# Patient Record
Sex: Female | Born: 1960 | Race: White | Hispanic: No | Marital: Married | Smoking: Current every day smoker
Health system: Southern US, Community
[De-identification: ages and names within clinical notes are randomized; demographics above are authoritative.]

## PROBLEM LIST (undated history)

## (undated) DIAGNOSIS — H409 Unspecified glaucoma: Secondary | ICD-10-CM

## (undated) DIAGNOSIS — K219 Gastro-esophageal reflux disease without esophagitis: Secondary | ICD-10-CM

## (undated) DIAGNOSIS — G43909 Migraine, unspecified, not intractable, without status migrainosus: Secondary | ICD-10-CM

## (undated) HISTORY — PX: OOPHORECTOMY: SHX86

## (undated) HISTORY — DX: Unspecified glaucoma: H40.9

## (undated) HISTORY — DX: Migraine, unspecified, not intractable, without status migrainosus: G43.909

## (undated) HISTORY — DX: Gastro-esophageal reflux disease without esophagitis: K21.9

---

## 2000-07-12 ENCOUNTER — Other Ambulatory Visit: Admission: RE | Admit: 2000-07-12 | Discharge: 2000-07-12 | Payer: Self-pay | Admitting: Obstetrics and Gynecology

## 2003-04-11 ENCOUNTER — Ambulatory Visit (HOSPITAL_COMMUNITY): Admission: RE | Admit: 2003-04-11 | Discharge: 2003-04-11 | Payer: Self-pay | Admitting: Family Medicine

## 2003-04-23 ENCOUNTER — Ambulatory Visit (HOSPITAL_COMMUNITY): Admission: RE | Admit: 2003-04-23 | Discharge: 2003-04-23 | Payer: Self-pay | Admitting: Family Medicine

## 2004-06-28 IMAGING — CR DG LUMBAR SPINE COMPLETE 4+V
5 series · 5 of 5 positions shown · non-contrast
Comparison: none

CLINICAL DATA: Low back pain. 
 FIVE VIEW LUMBAR SPINE
 There is minimal scoliosis with the convexity to the right.  The disc spaces are preserved.  The SI joints are normal. 
 IMPRESSION
 Minimal scoliosis with preservation of the disc spaces.

[view not recorded (1 of 5)]
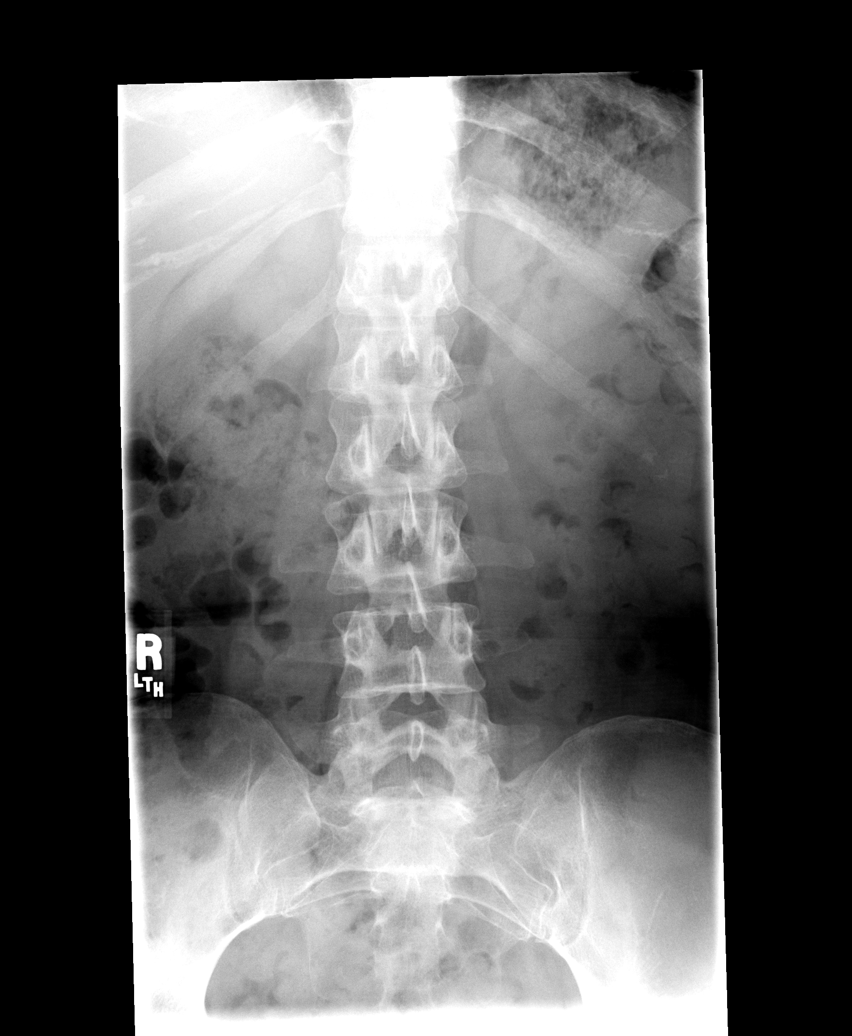

[view not recorded (2 of 5)]
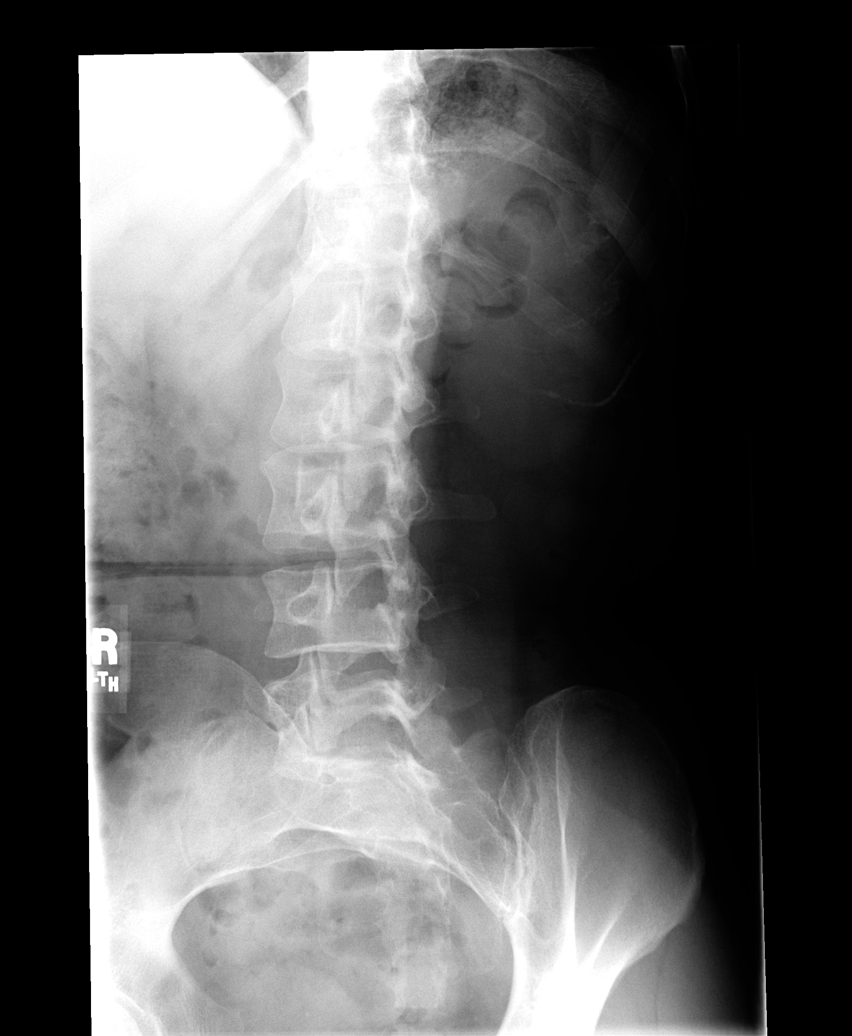

[view not recorded (3 of 5)]
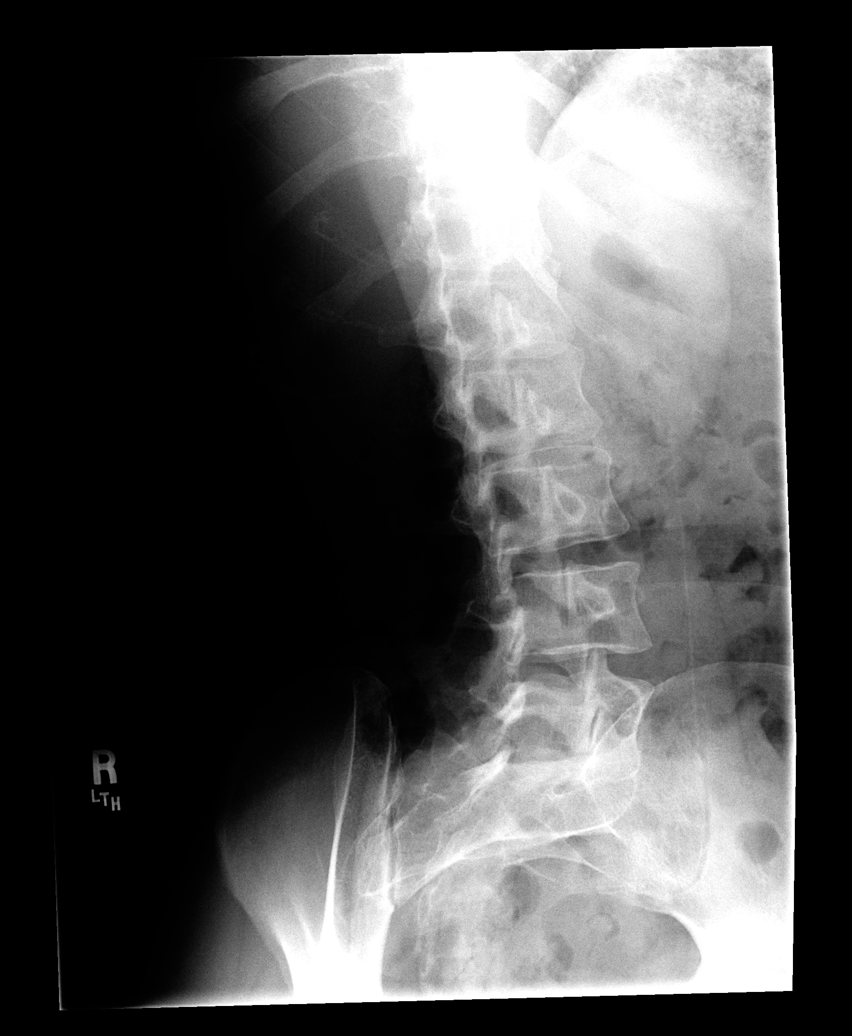

[view not recorded (4 of 5)]
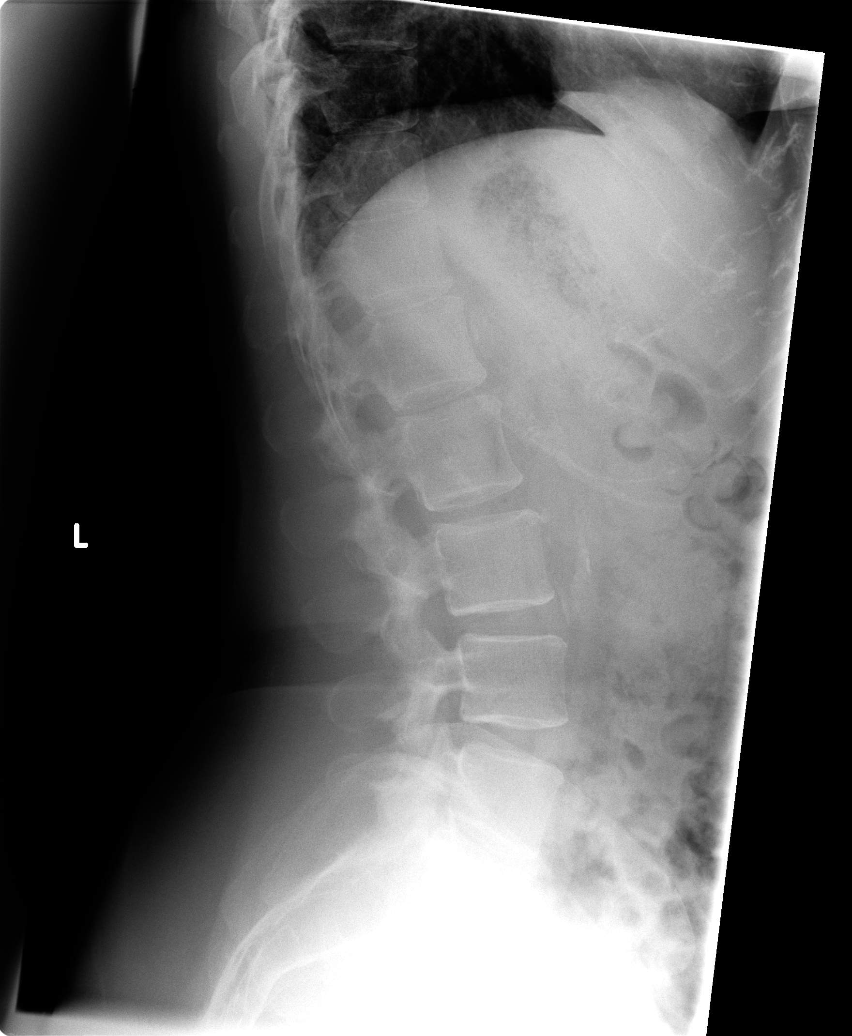

[view not recorded (5 of 5)]
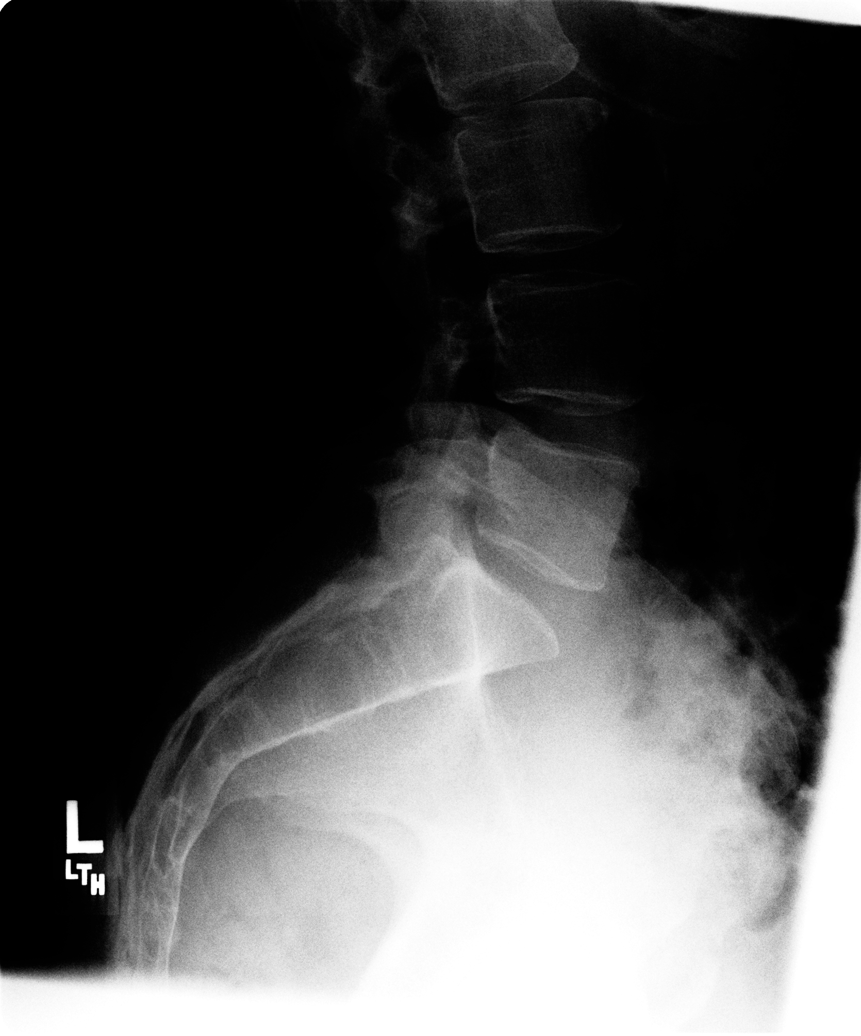

[5 of 5 positions shown; findings below may reference images not displayed]

## 2010-03-27 ENCOUNTER — Encounter: Payer: Self-pay | Admitting: Family Medicine

## 2012-06-06 ENCOUNTER — Other Ambulatory Visit: Payer: Self-pay | Admitting: Family Medicine

## 2012-07-06 ENCOUNTER — Encounter: Payer: Self-pay | Admitting: *Deleted

## 2012-07-08 ENCOUNTER — Encounter: Payer: Self-pay | Admitting: Nurse Practitioner

## 2012-07-08 ENCOUNTER — Ambulatory Visit (INDEPENDENT_AMBULATORY_CARE_PROVIDER_SITE_OTHER): Payer: BC Managed Care – PPO | Admitting: Nurse Practitioner

## 2012-07-08 VITALS — BP 158/102 | Wt 215.4 lb

## 2012-07-08 DIAGNOSIS — K219 Gastro-esophageal reflux disease without esophagitis: Secondary | ICD-10-CM | POA: Insufficient documentation

## 2012-07-08 DIAGNOSIS — F341 Dysthymic disorder: Secondary | ICD-10-CM

## 2012-07-08 DIAGNOSIS — G4700 Insomnia, unspecified: Secondary | ICD-10-CM | POA: Insufficient documentation

## 2012-07-08 DIAGNOSIS — R030 Elevated blood-pressure reading, without diagnosis of hypertension: Secondary | ICD-10-CM

## 2012-07-08 DIAGNOSIS — IMO0001 Reserved for inherently not codable concepts without codable children: Secondary | ICD-10-CM

## 2012-07-08 DIAGNOSIS — H409 Unspecified glaucoma: Secondary | ICD-10-CM | POA: Insufficient documentation

## 2012-07-08 DIAGNOSIS — Z13220 Encounter for screening for lipoid disorders: Secondary | ICD-10-CM

## 2012-07-08 DIAGNOSIS — F329 Major depressive disorder, single episode, unspecified: Secondary | ICD-10-CM | POA: Insufficient documentation

## 2012-07-08 DIAGNOSIS — F32A Depression, unspecified: Secondary | ICD-10-CM | POA: Insufficient documentation

## 2012-07-08 DIAGNOSIS — J309 Allergic rhinitis, unspecified: Secondary | ICD-10-CM

## 2012-07-08 DIAGNOSIS — J3089 Other allergic rhinitis: Secondary | ICD-10-CM

## 2012-07-08 DIAGNOSIS — R5383 Other fatigue: Secondary | ICD-10-CM

## 2012-07-08 DIAGNOSIS — R5381 Other malaise: Secondary | ICD-10-CM

## 2012-07-08 MED ORDER — HYDROCHLOROTHIAZIDE 25 MG PO TABS: 25.0000 mg | ORAL_TABLET | Freq: Every day | ORAL | Status: DC

## 2012-07-08 MED ORDER — TRAZODONE HCL 50 MG PO TABS: 50.0000 mg | ORAL_TABLET | Freq: Every day | ORAL | Status: DC

## 2012-07-08 MED ORDER — HYDROCODONE-ACETAMINOPHEN 5-325 MG PO TABS: 1.0000 | ORAL_TABLET | Freq: Three times a day (TID) | ORAL | Status: DC | PRN

## 2012-07-08 MED ORDER — CITALOPRAM HYDROBROMIDE 40 MG PO TABS: 40.0000 mg | ORAL_TABLET | Freq: Every day | ORAL | Status: DC

## 2012-07-08 NOTE — Assessment & Plan Note (Signed)
Mild at this point, gets regular eye exams. No medication at this time.

## 2012-07-08 NOTE — Patient Instructions (Addendum)
Sleeve gastrectomy.  [facility]  Dr. Lowell Guitar; Loratadine 10 mg; Nasacort AQ as directed.

## 2012-07-08 NOTE — Progress Notes (Signed)
Subjective:  Presents for routine followup. Sleeping well with trazodone, does not take it every night. Anxiety/depression symptoms are stable on Celexa. Reflux stable on OTC Pepcid. No exercise. 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 Took Sudafed this morning for her sinuses. Mild fatigue. Has not had lab work done in a long time. Since working out worse yesterday, has developed off-and-on head congestion itchy watery eyes sneezing ear pressure. No fever or sore throat. Minimal cough. No wheezing.  Objective:   BP 158/102  Wt 215 lb 6.4 oz (97.705 kg) NAD. Alert, oriented. TMs significant clear effusion, no erythema. Pharynx clear. Neck supple with minimal adenopathy. Lungs clear. Heart regular rate rhythm. Conjunctiva clear. BP on recheck right arm sitting 158/102. Central obesity noted. Waist circumference greater than 35 inches.  Assessment:Anxiety and depression  Insomnia  Perennial allergic rhinitis  Fatigue - Plan: Basic metabolic panel, Hepatic function panel, TSH, Vitamin D 25 hydroxy, Basic metabolic panel, Hepatic function panel, TSH, Vitamin D 25 hydroxy  Morbid obesity - Plan: Basic metabolic panel, Hepatic function panel, Basic metabolic panel, Hepatic function panel  Screening, lipid - Plan: Lipid panel, Lipid panel  Elevated blood pressure  Plan: Meds ordered this encounter  Medications  . DISCONTD: HYDROcodone-acetaminophen (NORCO/VICODIN) 5-325 MG per tablet    Sig: 1 tablet every 8 (eight) hours as needed.   . citalopram (CELEXA) 40 MG tablet    Sig: Take 1 tablet (40 mg total) by mouth daily.    Dispense:  90 tablet    Refill:  1    Order Specific Question:  Supervising Provider    Answer:  Merlyn Albert [[id number]]  . traZODone (DESYREL) 50 MG tablet    Sig: Take 1 tablet (50 mg total) by mouth at bedtime.    Dispense:  90 tablet    Refill:  1    Order Specific Question:  Supervising Provider    Answer:  Merlyn Albert [[id number]]  .  hydrochlorothiazide (HYDRODIURIL) 25 MG tablet    Sig: Take 1 tablet (25 mg total) by mouth daily.    Dispense:  90 tablet    Refill:  1    Order Specific Question:  Supervising Provider    Answer:  Merlyn Albert [[id number]]  . HYDROcodone-acetaminophen (NORCO/VICODIN) 5-325 MG per tablet    Sig: Take 1 tablet by mouth every 8 (eight) hours as needed.    Dispense:  30 tablet    Refill:  0    Order Specific Question:  Supervising Provider    Answer:  Merlyn Albert [[id number]]   advised patient to take HCTZ daily. Has been using this off and on in the past for occasional swelling. Recheck BP outside office and bring results to next visit. Lengthy discussion regarding her weight. Discussed options including bariatric surgery. No medication today since blood pressure is elevated. Will reconsider medication once blood pressure is under control. Recommend loratadine 10 mg daily and Nasacort AQ as directed. Avoid decongestants due to blood pressure elevation. Recheck in 6 months, sooner if any problems.

## 2012-07-08 NOTE — Assessment & Plan Note (Signed)
Stable on Pepcid and dietary measures.

## 2012-07-08 NOTE — Assessment & Plan Note (Signed)
Well-controlled with Celexa 40 mg daily.

## 2012-07-08 NOTE — Assessment & Plan Note (Signed)
Taking trazodone for sleep.  

## 2012-08-01 ENCOUNTER — Other Ambulatory Visit: Payer: Self-pay | Admitting: Nurse Practitioner

## 2012-08-27 ENCOUNTER — Other Ambulatory Visit: Payer: Self-pay | Admitting: Nurse Practitioner

## 2012-08-28 ENCOUNTER — Other Ambulatory Visit: Payer: Self-pay | Admitting: *Deleted

## 2012-08-28 MED ORDER — HYDROCODONE-ACETAMINOPHEN 5-325 MG PO TABS: 1.0000 | ORAL_TABLET | Freq: Three times a day (TID) | ORAL | Status: DC | PRN

## 2012-09-23 ENCOUNTER — Other Ambulatory Visit: Payer: Self-pay | Admitting: Family Medicine

## 2012-09-23 NOTE — Telephone Encounter (Signed)
Ok times one 

## 2012-09-23 NOTE — Telephone Encounter (Signed)
RX called in .

## 2012-10-21 ENCOUNTER — Other Ambulatory Visit: Payer: Self-pay | Admitting: Family Medicine

## 2012-10-21 NOTE — Telephone Encounter (Signed)
Last seen 5/14

## 2012-10-21 NOTE — Telephone Encounter (Signed)
Sorry, need paper chart

## 2012-10-21 NOTE — Telephone Encounter (Signed)
Ok times one 

## 2012-11-28 ENCOUNTER — Other Ambulatory Visit: Payer: Self-pay | Admitting: Family Medicine

## 2012-11-28 NOTE — Telephone Encounter (Signed)
Ok times one 

## 2012-11-29 ENCOUNTER — Other Ambulatory Visit: Payer: Self-pay | Admitting: Family Medicine

## 2012-12-02 NOTE — Telephone Encounter (Signed)
Chart in message pile 

## 2012-12-02 NOTE — Telephone Encounter (Signed)
Paper chart plz

## 2012-12-03 ENCOUNTER — Other Ambulatory Visit: Payer: Self-pay | Admitting: *Deleted

## 2012-12-03 MED ORDER — HYDROCODONE-ACETAMINOPHEN 5-325 MG PO TABS: ORAL_TABLET | ORAL | Status: DC

## 2012-12-03 NOTE — Telephone Encounter (Signed)
Ok times one ov bef furth

## 2012-12-19 ENCOUNTER — Ambulatory Visit (INDEPENDENT_AMBULATORY_CARE_PROVIDER_SITE_OTHER): Payer: PRIVATE HEALTH INSURANCE | Admitting: Nurse Practitioner

## 2012-12-19 VITALS — BP 118/86 | Temp 98.7°F | Ht 62.0 in | Wt 206.6 lb

## 2012-12-19 DIAGNOSIS — J0110 Acute frontal sinusitis, unspecified: Secondary | ICD-10-CM

## 2012-12-19 DIAGNOSIS — H9209 Otalgia, unspecified ear: Secondary | ICD-10-CM

## 2012-12-19 DIAGNOSIS — H9201 Otalgia, right ear: Secondary | ICD-10-CM

## 2012-12-19 MED ORDER — FLUCONAZOLE 150 MG PO TABS: ORAL_TABLET | ORAL | Status: DC

## 2012-12-19 MED ORDER — AMOXICILLIN-POT CLAVULANATE 875-125 MG PO TABS: 1.0000 | ORAL_TABLET | Freq: Two times a day (BID) | ORAL | Status: DC

## 2012-12-19 MED ORDER — HYDROCODONE-ACETAMINOPHEN 5-325 MG PO TABS: 1.0000 | ORAL_TABLET | ORAL | Status: DC | PRN

## 2012-12-19 MED ORDER — METHYLPREDNISOLONE ACETATE 40 MG/ML IJ SUSP
40.0000 mg | Freq: Once | INTRAMUSCULAR | Status: AC
  Administered 2012-12-19: 40 mg via INTRAMUSCULAR

## 2012-12-23 ENCOUNTER — Encounter: Payer: Self-pay | Admitting: Nurse Practitioner

## 2012-12-23 NOTE — Progress Notes (Signed)
Subjective:  Presents for complaints of right ear pain for the past 2 days. Has had head congestion and cough for the past few weeks. No fever. Occasional cough. No wheezing. Facial pressure along the right side. Smoker.  Objective:   BP 118/86  Temp(Src) 98.7 F (37.1 C) (Oral)  Ht 5\' 2"  (1.575 m)  Wt 206 lb 9.6 oz (93.713 kg)  BMI 37.78 kg/m2 NAD. Alert, oriented. TMs significant clear effusion, no erythema. Pharynx injected with PND noted. Neck supple with mild soft slightly tender adenopathy. Lungs clear. Heart regular rate rhythm.  Assessment:Acute frontal sinusitis - Plan: methylPREDNISolone acetate (DEPO-MEDROL) injection 40 mg  Ear pain, right - Plan: methylPREDNISolone acetate (DEPO-MEDROL) injection 40 mg  Plan: Meds ordered this encounter  Medications  . HYDROcodone-acetaminophen (NORCO/VICODIN) 5-325 MG per tablet    Sig: Take 1 tablet by mouth every 4 (four) hours as needed for pain.    Dispense:  30 tablet    Refill:  0    Order Specific Question:  Supervising Provider    Answer:  Merlyn Albert [[id number]]  . methylPREDNISolone acetate (DEPO-MEDROL) injection 40 mg    Sig:   . amoxicillin-clavulanate (AUGMENTIN) 875-125 MG per tablet    Sig: Take 1 tablet by mouth 2 (two) times daily.    Dispense:  20 tablet    Refill:  0    Order Specific Question:  Supervising Provider    Answer:  Merlyn Albert [[id number]]  . fluconazole (DIFLUCAN) 150 MG tablet    Sig: One po qd prn yeast infection; may repeat in 3-4 days if needed    Dispense:  2 tablet    Refill:  0    Order Specific Question:  Supervising Provider    Answer:  Merlyn Albert [[id number]]   OTC meds as directed for congestion. Call back next week if no improvement, sooner if worse.

## 2012-12-26 ENCOUNTER — Ambulatory Visit (INDEPENDENT_AMBULATORY_CARE_PROVIDER_SITE_OTHER): Payer: PRIVATE HEALTH INSURANCE | Admitting: Family Medicine

## 2012-12-26 ENCOUNTER — Encounter: Payer: Self-pay | Admitting: Family Medicine

## 2012-12-26 VITALS — BP 130/80 | Temp 98.8°F | Ht 64.0 in | Wt 206.0 lb

## 2012-12-26 DIAGNOSIS — J0110 Acute frontal sinusitis, unspecified: Secondary | ICD-10-CM

## 2012-12-26 MED ORDER — LEVOFLOXACIN 500 MG PO TABS: 500.0000 mg | ORAL_TABLET | Freq: Every day | ORAL | Status: AC

## 2012-12-26 NOTE — Progress Notes (Signed)
  Subjective:    Patient ID: Karen Heath, female    DOB: [birth year], 52 y.o.   MRN: [id number]  Sinus Problem  Patient arrives with complaint of continued sinus issues. Was seen late last week and given a shot of steroid and Augmentin and Hydrocodone but patient states she is not feeling any better. Had to go out of town [place] and came back with continued ear pain, throat pain, headache and cough.  Still having sig headache and cough. No true wheeziness,  Cough not that bad at this time   Right sided pressure nd discomfort   Review of Systems No vomiting no diarrhea no rash ROS otherwise negative    Objective:   Physical Exam  Alert hydration good. Vitals reviewed. HEENT moderate nasal congestion frontal tenderness. Neck supple lungs clear heart regular in rhythm.      Assessment & Plan:  Impression persistent rhinosinusitis plan Levaquin 500 daily 10 days symptomatic care discussed. Warning signs discussed. WSL

## 2013-01-16 ENCOUNTER — Encounter: Payer: Self-pay | Admitting: Nurse Practitioner

## 2013-01-16 ENCOUNTER — Ambulatory Visit (INDEPENDENT_AMBULATORY_CARE_PROVIDER_SITE_OTHER): Payer: PRIVATE HEALTH INSURANCE | Admitting: Nurse Practitioner

## 2013-01-16 VITALS — BP 144/100 | Ht 62.0 in | Wt 205.6 lb

## 2013-01-16 DIAGNOSIS — F329 Major depressive disorder, single episode, unspecified: Secondary | ICD-10-CM

## 2013-01-16 DIAGNOSIS — K219 Gastro-esophageal reflux disease without esophagitis: Secondary | ICD-10-CM

## 2013-01-16 DIAGNOSIS — F341 Dysthymic disorder: Secondary | ICD-10-CM

## 2013-01-16 DIAGNOSIS — F32A Depression, unspecified: Secondary | ICD-10-CM

## 2013-01-16 MED ORDER — ALPRAZOLAM 0.5 MG PO TABS: ORAL_TABLET | ORAL | Status: DC

## 2013-01-16 MED ORDER — RANITIDINE HCL 300 MG PO TABS: 300.0000 mg | ORAL_TABLET | Freq: Every day | ORAL | Status: DC

## 2013-01-16 MED ORDER — TRAZODONE HCL 100 MG PO TABS: 100.0000 mg | ORAL_TABLET | Freq: Every day | ORAL | Status: DC

## 2013-01-19 ENCOUNTER — Encounter: Payer: Self-pay | Admitting: Nurse Practitioner

## 2013-01-19 NOTE — Assessment & Plan Note (Signed)
Medications  . DISCONTD: levofloxacin (LEVAQUIN) 500 MG tablet    Sig:   . ALPRAZolam (XANAX) 0.5 MG tablet    Sig: 1/2 - 1 po BID prn anxiety    Dispense:  30 tablet    Refill:  2    Order Specific Question:  Supervising Provider    Answer:  Merlyn Albert [[id number]]  . traZODone (DESYREL) 100 MG tablet    Sig: Take 1 tablet (100 mg total) by mouth at bedtime.    Dispense:  90 tablet    Refill:  1    Order Specific Question:  Supervising Provider    Answer:  Merlyn Albert [[id number]]  . ranitidine (ZANTAC) 300 MG tablet    Sig: Take 1 tablet (300 mg total) by mouth at bedtime.    Dispense:  90 tablet    Refill:  1    Order Specific Question:  Supervising Provider    Answer:  Merlyn Albert [[id number]]   increase trazodone to 100 mg each bedtime. Increase Zantac to 300 mg daily. Add Zantac for breakthrough anxiety symptoms. Call back in 2-3 weeks if no improvement, sooner if needed. Our plan is to hopefully reduce dose of medications once her stress has improved.

## 2013-01-19 NOTE — Assessment & Plan Note (Signed)
Medications  . DISCONTD: levofloxacin (LEVAQUIN) 500 MG tablet    Sig:   . ALPRAZolam (XANAX) 0.5 MG tablet    Sig: 1/2 - 1 po BID prn anxiety    Dispense:  30 tablet    Refill:  2    Order Specific Question:  Supervising Provider    Answer:  LUKING, WILLIAM S [[id number]]  . traZODone (DESYREL) 100 MG tablet    Sig: Take 1 tablet (100 mg total) by mouth at bedtime.    Dispense:  90 tablet    Refill:  1    Order Specific Question:  Supervising Provider    Answer:  LUKING, WILLIAM S [[id number]]  . ranitidine (ZANTAC) 300 MG tablet    Sig: Take 1 tablet (300 mg total) by mouth at bedtime.    Dispense:  90 tablet    Refill:  1    Order Specific Question:  Supervising Provider    Answer:  LUKING, WILLIAM S [[id number]]   increase trazodone to 100 mg each bedtime. Increase Zantac to 300 mg daily. Add Zantac for breakthrough anxiety symptoms. Call back in 2-3 weeks if no improvement, sooner if needed. Our plan is to hopefully reduce dose of medications once her stress has improved. 

## 2013-01-19 NOTE — Progress Notes (Signed)
Subjective:  Presents with complaints of increased anxiety related to work. States it is a temporary situation that should improve over time. Feels "sick" contents. No panic attacks. Increased fatigue. Sleeping well overall. Occasional breakthrough reflux symptoms, taking Zantac 150 mg daily. No abdominal pain. BMs normal limit. Emotional lability. Denies suicidal thoughts or ideation.  Objective:   BP 144/100  Ht 5\' 2"  (1.575 m)  Wt 205 lb 9.6 oz (93.26 kg)  BMI 37.60 kg/m2 NAD. Alert, oriented. Lungs clear. Heart regular rate rhythm. Abdomen soft nondistended nontender. BP on recheck left arm sitting 138/102.  Assessment:Anxiety and depression  GERD (gastroesophageal reflux disease)  Plan: Meds ordered this encounter  Medications  . DISCONTD: levofloxacin (LEVAQUIN) 500 MG tablet    Sig:   . ALPRAZolam (XANAX) 0.5 MG tablet    Sig: 1/2 - 1 po BID prn anxiety    Dispense:  30 tablet    Refill:  2    Order Specific Question:  Supervising Provider    Answer:  Merlyn Albert [[id number]]  . traZODone (DESYREL) 100 MG tablet    Sig: Take 1 tablet (100 mg total) by mouth at bedtime.    Dispense:  90 tablet    Refill:  1    Order Specific Question:  Supervising Provider    Answer:  Merlyn Albert [[id number]]  . ranitidine (ZANTAC) 300 MG tablet    Sig: Take 1 tablet (300 mg total) by mouth at bedtime.    Dispense:  90 tablet    Refill:  1    Order Specific Question:  Supervising Provider    Answer:  Merlyn Albert [[id number]]   increase trazodone to 100 mg each bedtime. Increase Zantac to 300 mg daily. Add Zantac for breakthrough anxiety symptoms. Call back in 2-3 weeks if no improvement, sooner if needed. Our plan is to hopefully reduce dose of medications once her stress has improved.

## 2013-02-05 ENCOUNTER — Other Ambulatory Visit: Payer: Self-pay | Admitting: Nurse Practitioner

## 2013-03-28 ENCOUNTER — Other Ambulatory Visit: Payer: Self-pay | Admitting: *Deleted

## 2013-03-28 MED ORDER — ALPRAZOLAM 0.5 MG PO TABS: ORAL_TABLET | ORAL | Status: DC

## 2013-03-28 NOTE — Progress Notes (Signed)
Ok plus 2 ref 

## 2013-09-25 ENCOUNTER — Other Ambulatory Visit: Payer: Self-pay | Admitting: Nurse Practitioner

## 2013-09-29 ENCOUNTER — Other Ambulatory Visit: Payer: Self-pay | Admitting: Nurse Practitioner

## 2013-09-29 MED ORDER — TRAZODONE HCL 100 MG PO TABS: 100.0000 mg | ORAL_TABLET | Freq: Every day | ORAL | Status: DC

## 2014-02-13 ENCOUNTER — Other Ambulatory Visit: Payer: Self-pay | Admitting: Family Medicine

## 2014-03-30 ENCOUNTER — Encounter: Payer: Self-pay | Admitting: Nurse Practitioner

## 2014-03-30 ENCOUNTER — Ambulatory Visit (INDEPENDENT_AMBULATORY_CARE_PROVIDER_SITE_OTHER): Payer: PRIVATE HEALTH INSURANCE | Admitting: Nurse Practitioner

## 2014-03-30 VITALS — BP 132/86 | Temp 98.3°F | Ht 62.0 in | Wt 208.0 lb

## 2014-03-30 DIAGNOSIS — J0120 Acute ethmoidal sinusitis, unspecified: Secondary | ICD-10-CM

## 2014-03-30 DIAGNOSIS — H109 Unspecified conjunctivitis: Secondary | ICD-10-CM

## 2014-03-30 MED ORDER — AMOXICILLIN-POT CLAVULANATE 875-125 MG PO TABS: 1.0000 | ORAL_TABLET | Freq: Two times a day (BID) | ORAL | Status: DC

## 2014-03-30 MED ORDER — HYDROCHLOROTHIAZIDE 25 MG PO TABS: ORAL_TABLET | ORAL | Status: DC

## 2014-03-30 MED ORDER — HYDROCODONE-ACETAMINOPHEN 5-325 MG PO TABS: 1.0000 | ORAL_TABLET | ORAL | Status: DC | PRN

## 2014-03-30 MED ORDER — CITALOPRAM HYDROBROMIDE 40 MG PO TABS: ORAL_TABLET | ORAL | Status: DC

## 2014-03-30 MED ORDER — SULFACETAMIDE SODIUM 10 % OP SOLN: 1.0000 [drp] | Freq: Four times a day (QID) | OPHTHALMIC | Status: DC

## 2014-03-31 ENCOUNTER — Encounter: Payer: Self-pay | Admitting: Nurse Practitioner

## 2014-03-31 NOTE — Progress Notes (Signed)
Subjective:  Presents for c/o possible sinus infection for the past 2 weeks. No fever. Ethmoid area headache. Occasional cough, non productive. No wheezing. Sore throat. No ear pain. Had a stye on her right eye which resolved. Within past 24 hours has developed soreness, itching and drainage. Matted together this am. No visual changes or eye pain. No involvement of left eye at this time. Has appt soon for regular follow up.  Objective:   BP 132/86 mmHg  Temp(Src) 98.3 F (36.8 C)  Ht 5\' 2"  (1.575 m)  Wt 208 lb (94.348 kg)  BMI 38.03 kg/m2 NAD. Alert, oriented. Right conjunctiva mildly injected. No lesions noted. left conjunctiva very mildly injected. No preauricular adenopathy. TMs mild clear effusion, partially blocked with cerumen. Pharynx mild erythema with PND noted. Neck supple with mild anterior adenopathy. Lungs clear. Heart RRR.   Assessment: Acute ethmoidal sinusitis, recurrence not specified  Conjunctivitis of right eye  Plan:  Meds ordered this encounter  Medications  . amoxicillin-clavulanate (AUGMENTIN) 875-125 MG per tablet    Sig: Take 1 tablet by mouth 2 (two) times daily.    Dispense:  20 tablet    Refill:  0    Order Specific Question:  Supervising Provider    Answer:  Merlyn AlbertLUKING, WILLIAM S [[id number]]  . sulfacetamide (BLEPH-10) 10 % ophthalmic solution    Sig: Place 1 drop into the right eye 4 (four) times daily.    Dispense:  10 mL    Refill:  0    Order Specific Question:  Supervising Provider    Answer:  Merlyn AlbertLUKING, WILLIAM S [[id number]]  . citalopram (CELEXA) 40 MG tablet    Sig: TAKE ONE TABLET BY MOUTH ONCE DAILY **NEEDS OFFICE VISIT**    Dispense:  30 tablet    Refill:  0    Order Specific Question:  Supervising Provider    Answer:  Merlyn AlbertLUKING, WILLIAM S [[id number]]  . hydrochlorothiazide (HYDRODIURIL) 25 MG tablet    Sig: TAKE ONE TABLET BY MOUTH ONCE DAILY **NEEDS OFFICE VISIT**    Dispense:  30 tablet    Refill:  0    Order Specific Question:  Supervising Provider   Answer:  Merlyn AlbertLUKING, WILLIAM S [[id number]]  . HYDROcodone-acetaminophen (NORCO/VICODIN) 5-325 MG per tablet    Sig: Take 1 tablet by mouth every 4 (four) hours as needed.    Dispense:  30 tablet    Refill:  0    Order Specific Question:  Supervising Provider    Answer:  Merlyn AlbertLUKING, WILLIAM S [[id number]]   Call back by end of week if no improvement. Warm compresses to remove drainage. OTC meds as directed for congestion. Given one month refill on meds until routine follow up. One refill on hydrocodone for headache.  Return if symptoms worsen or fail to improve.

## 2014-04-06 ENCOUNTER — Ambulatory Visit (INDEPENDENT_AMBULATORY_CARE_PROVIDER_SITE_OTHER): Payer: PRIVATE HEALTH INSURANCE | Admitting: Nurse Practitioner

## 2014-04-06 ENCOUNTER — Encounter: Payer: Self-pay | Admitting: Nurse Practitioner

## 2014-04-06 VITALS — BP 122/88 | Ht 62.0 in | Wt 202.0 lb

## 2014-04-06 DIAGNOSIS — F418 Other specified anxiety disorders: Secondary | ICD-10-CM

## 2014-04-06 DIAGNOSIS — F419 Anxiety disorder, unspecified: Principal | ICD-10-CM

## 2014-04-06 DIAGNOSIS — G4700 Insomnia, unspecified: Secondary | ICD-10-CM

## 2014-04-06 DIAGNOSIS — F329 Major depressive disorder, single episode, unspecified: Secondary | ICD-10-CM

## 2014-04-06 DIAGNOSIS — G43909 Migraine, unspecified, not intractable, without status migrainosus: Secondary | ICD-10-CM | POA: Insufficient documentation

## 2014-04-06 DIAGNOSIS — G43009 Migraine without aura, not intractable, without status migrainosus: Secondary | ICD-10-CM

## 2014-04-06 DIAGNOSIS — F32A Depression, unspecified: Secondary | ICD-10-CM

## 2014-04-06 DIAGNOSIS — K219 Gastro-esophageal reflux disease without esophagitis: Secondary | ICD-10-CM

## 2014-04-06 MED ORDER — HYDROCODONE-ACETAMINOPHEN 5-325 MG PO TABS: 1.0000 | ORAL_TABLET | ORAL | Status: DC | PRN

## 2014-04-06 MED ORDER — LEVOFLOXACIN 500 MG PO TABS: 500.0000 mg | ORAL_TABLET | Freq: Every day | ORAL | Status: DC

## 2014-04-06 MED ORDER — CITALOPRAM HYDROBROMIDE 40 MG PO TABS: ORAL_TABLET | ORAL | Status: DC

## 2014-04-06 MED ORDER — TRAZODONE HCL 100 MG PO TABS: 100.0000 mg | ORAL_TABLET | Freq: Every day | ORAL | Status: DC

## 2014-04-06 MED ORDER — HYDROCHLOROTHIAZIDE 25 MG PO TABS: ORAL_TABLET | ORAL | Status: DC

## 2014-04-06 NOTE — Progress Notes (Signed)
Subjective:  Presents for recheck. Had significant side effects on Augmentin. Eye symptoms resolved. Continues to have some headache. Nausea, no vomiting. Worsened cough. Anxiety and depression stable on Celexa. Does not take Xanax. Takes hydrocodone on prn basis for sinus headache or migraine. No more than 30 per month. Reflux stable. Takes OTC Zantac prn. Sleeping well.  Objective:   BP 122/88 mmHg  Ht 5\' 2"  (1.575 m)  Wt 202 lb (91.627 kg)  BMI 36.94 kg/m2 NAD. Alert, oriented. Right TM: clear effusion. Left TM: mostly obscured with cerumen; TM no erythema. Pharynx green PND. Neck supple with mild anterior adenopathy. Lungs clear. Heart RRR. Abdomen soft, non tender.   Assessment:  Problem List Items Addressed This Visit      Cardiovascular and Mediastinum   Migraines   Relevant Medications   citalopram (CELEXA) tablet   hydrochlorothiazide tablet   traZODone (DESYREL) tablet   HYDROcodone-acetaminophen (NORCO/VICODIN) 5-325 MG per tablet     Digestive   GERD (gastroesophageal reflux disease) stable     Other   Anxiety and depression - Primary   Insomnia     Plan:  Meds ordered this encounter  Medications  . levofloxacin (LEVAQUIN) 500 MG tablet    Sig: Take 1 tablet (500 mg total) by mouth daily.    Dispense:  10 tablet    Refill:  0    Order Specific Question:  Supervising Provider    Answer:  Merlyn AlbertLUKING, WILLIAM S [[id number]]  . citalopram (CELEXA) 40 MG tablet    Sig: TAKE ONE TABLET BY MOUTH ONCE DAILY    Dispense:  90 tablet    Refill:  1    Order Specific Question:  Supervising Provider    Answer:  Merlyn AlbertLUKING, WILLIAM S [[id number]]  . hydrochlorothiazide (HYDRODIURIL) 25 MG tablet    Sig: TAKE ONE TABLET BY MOUTH ONCE DAILY    Dispense:  90 tablet    Refill:  1    Order Specific Question:  Supervising Provider    Answer:  Merlyn AlbertLUKING, WILLIAM S [[id number]]  . DISCONTD: HYDROcodone-acetaminophen (NORCO/VICODIN) 5-325 MG per tablet    Sig: Take 1 tablet by mouth every 4 (four) hours as  needed.    Dispense:  30 tablet    Refill:  0    Order Specific Question:  Supervising Provider    Answer:  Merlyn AlbertLUKING, WILLIAM S [[id number]]  . traZODone (DESYREL) 100 MG tablet    Sig: Take 1 tablet (100 mg total) by mouth at bedtime.    Dispense:  90 tablet    Refill:  1    Order Specific Question:  Supervising Provider    Answer:  Merlyn AlbertLUKING, WILLIAM S [[id number]]  . DISCONTD: HYDROcodone-acetaminophen (NORCO/VICODIN) 5-325 MG per tablet    Sig: Take 1 tablet by mouth every 4 (four) hours as needed.    Dispense:  30 tablet    Refill:  0    May fill 30 days from 04/06/14    Order Specific Question:  Supervising Provider    Answer:  Merlyn AlbertLUKING, WILLIAM S [[id number]]  . HYDROcodone-acetaminophen (NORCO/VICODIN) 5-325 MG per tablet    Sig: Take 1 tablet by mouth every 4 (four) hours as needed.    Dispense:  30 tablet    Refill:  0    May fill 60 days from 04/06/14    Order Specific Question:  Supervising Provider    Answer:  Riccardo DubinLUKING, WILLIAM S [[id number]]   Strongly recommend PE with labs.  Return in about 6 months (around 10/05/2014). Recheck  sooner if she needs refill on pain meds.

## 2014-08-31 ENCOUNTER — Encounter: Payer: Self-pay | Admitting: Nurse Practitioner

## 2014-08-31 ENCOUNTER — Ambulatory Visit (INDEPENDENT_AMBULATORY_CARE_PROVIDER_SITE_OTHER): Payer: PRIVATE HEALTH INSURANCE | Admitting: Nurse Practitioner

## 2014-08-31 VITALS — BP 132/90 | Ht 62.0 in | Wt 192.1 lb

## 2014-08-31 DIAGNOSIS — M6248 Contracture of muscle, other site: Secondary | ICD-10-CM

## 2014-08-31 DIAGNOSIS — M62838 Other muscle spasm: Secondary | ICD-10-CM

## 2014-08-31 DIAGNOSIS — G43009 Migraine without aura, not intractable, without status migrainosus: Secondary | ICD-10-CM

## 2014-08-31 DIAGNOSIS — Z79891 Long term (current) use of opiate analgesic: Secondary | ICD-10-CM | POA: Diagnosis not present

## 2014-08-31 MED ORDER — CHLORZOXAZONE 500 MG PO TABS: 500.0000 mg | ORAL_TABLET | Freq: Three times a day (TID) | ORAL | Status: DC | PRN

## 2014-08-31 MED ORDER — HYDROCODONE-ACETAMINOPHEN 5-325 MG PO TABS: 1.0000 | ORAL_TABLET | ORAL | Status: DC | PRN

## 2014-08-31 NOTE — Patient Instructions (Signed)
Icy Hot smart Relief OTC TENS unit

## 2014-09-04 ENCOUNTER — Encounter: Payer: Self-pay | Admitting: Nurse Practitioner

## 2014-09-04 DIAGNOSIS — Z79891 Long term (current) use of opiate analgesic: Secondary | ICD-10-CM | POA: Insufficient documentation

## 2014-09-04 NOTE — Progress Notes (Signed)
Subjective:  Presents for routine follow-up. Taking her pain medicine as needed. Anywhere from 0-3 per day. Continues to have tight muscles and pain particularly in the right upper back area. Pain medication last her to function and perform activities of daily living.   Objective:   BP 132/90 mmHg  Ht 5\' 2"  (1.575 m)  Wt 192 lb 2 oz (87.147 kg)  BMI 35.13 kg/m2 NAD. Alert, oriented. Lungs clear. Heart regular rhythm. Very tight tender muscles noted along the upper back and neck area. Good ROM of the neck noted.  Assessment:  Problem List Items Addressed This Visit      Cardiovascular and Mediastinum   Migraines - Primary   Relevant Medications   HYDROcodone-acetaminophen (NORCO/VICODIN) 5-325 MG per tablet   chlorzoxazone (PARAFON) 500 MG tablet     Other   Encounter for long-term use of opiate analgesic   Relevant Orders   ToxASSURE Select 13 (MW), Urine (Completed)    Other Visit Diagnoses    Muscle spasms of head or neck          Plan:  Meds ordered this encounter  Medications  . DISCONTD: HYDROcodone-acetaminophen (NORCO/VICODIN) 5-325 MG per tablet    Sig: Take 1 tablet by mouth every 4 (four) hours as needed.    Dispense:  30 tablet    Refill:  0    May fill 60 days from 08/31/14    Order Specific Question:  Supervising Provider    Answer:  Merlyn AlbertLUKING, WILLIAM S [[id number]]  . DISCONTD: HYDROcodone-acetaminophen (NORCO/VICODIN) 5-325 MG per tablet    Sig: Take 1 tablet by mouth every 4 (four) hours as needed.    Dispense:  30 tablet    Refill:  0    May fill 30 days from 08/31/14    Order Specific Question:  Supervising Provider    Answer:  Merlyn AlbertLUKING, WILLIAM S [[id number]]  . HYDROcodone-acetaminophen (NORCO/VICODIN) 5-325 MG per tablet    Sig: Take 1 tablet by mouth every 4 (four) hours as needed.    Dispense:  30 tablet    Refill:  0    Order Specific Question:  Supervising Provider    Answer:  Merlyn AlbertLUKING, WILLIAM S [[id number]]  . chlorzoxazone (PARAFON) 500 MG tablet    Sig: Take 1  tablet (500 mg total) by mouth 3 (three) times daily as needed for muscle spasms.    Dispense:  30 tablet    Refill:  0    Order Specific Question:  Supervising Provider    Answer:  Merlyn AlbertLUKING, WILLIAM S [[id number]]   Controlled substance agreement and urine screening done today. Discussed addiction potential of hydrocodone. Limit to no more than 30 per month. Add muscle relaxant. Recommend massage therapy, OTC TENS unit and ice/heat applications to neck area. Discussed importance of stress reduction. Strongly recommend preventive health physical. Return in about 3 months (around 12/01/2014) for recheck.

## 2014-09-05 LAB — TOXASSURE SELECT 13 (MW), URINE: PDF: 0

## 2014-12-01 ENCOUNTER — Ambulatory Visit: Payer: PRIVATE HEALTH INSURANCE | Admitting: Nurse Practitioner

## 2014-12-04 ENCOUNTER — Encounter: Payer: Self-pay | Admitting: Nurse Practitioner

## 2014-12-04 ENCOUNTER — Ambulatory Visit (INDEPENDENT_AMBULATORY_CARE_PROVIDER_SITE_OTHER): Payer: PRIVATE HEALTH INSURANCE | Admitting: Nurse Practitioner

## 2014-12-04 VITALS — BP 138/88 | Ht 62.0 in | Wt 188.2 lb

## 2014-12-04 DIAGNOSIS — F329 Major depressive disorder, single episode, unspecified: Secondary | ICD-10-CM

## 2014-12-04 DIAGNOSIS — R5383 Other fatigue: Secondary | ICD-10-CM

## 2014-12-04 DIAGNOSIS — F418 Other specified anxiety disorders: Secondary | ICD-10-CM | POA: Diagnosis not present

## 2014-12-04 DIAGNOSIS — F419 Anxiety disorder, unspecified: Secondary | ICD-10-CM

## 2014-12-04 DIAGNOSIS — Z13220 Encounter for screening for lipoid disorders: Secondary | ICD-10-CM

## 2014-12-04 DIAGNOSIS — G4700 Insomnia, unspecified: Secondary | ICD-10-CM

## 2014-12-04 DIAGNOSIS — Z79891 Long term (current) use of opiate analgesic: Secondary | ICD-10-CM | POA: Diagnosis not present

## 2014-12-04 DIAGNOSIS — F32A Depression, unspecified: Secondary | ICD-10-CM

## 2014-12-04 MED ORDER — HYDROCHLOROTHIAZIDE 25 MG PO TABS: ORAL_TABLET | ORAL | Status: DC

## 2014-12-04 MED ORDER — HYDROCODONE-ACETAMINOPHEN 5-325 MG PO TABS: 1.0000 | ORAL_TABLET | ORAL | Status: DC | PRN

## 2014-12-04 MED ORDER — CHLORZOXAZONE 500 MG PO TABS: 500.0000 mg | ORAL_TABLET | Freq: Three times a day (TID) | ORAL | Status: DC | PRN

## 2014-12-04 MED ORDER — TRAZODONE HCL 100 MG PO TABS: 100.0000 mg | ORAL_TABLET | Freq: Every day | ORAL | Status: DC

## 2014-12-04 MED ORDER — CITALOPRAM HYDROBROMIDE 40 MG PO TABS: ORAL_TABLET | ORAL | Status: DC

## 2014-12-04 NOTE — Progress Notes (Signed)
Subjective:  This patient was seen today for chronic pain  The medication list was reviewed and updated.   -Compliance with pain medication: yes  The patient was advised the importance of maintaining medication and not using illegal substances with these.  Refills needed: yes  The patient was educated that we can provide 3 monthly scripts for their medication, it is their responsibility to follow the instructions.  Side effects or complications from medications: none  Patient is aware that pain medications are meant to minimize the severity of the pain to allow their pain levels to improve to allow for better function. They are aware of that pain medications cannot totally remove their pain.  Due for UDT ( at least once per year) : done June 2016.  Also needs refills on her Celexa and trazodone both of which are working very well at this time. Continues to experience some fatigue. Trazodone working well for sleep.      Objective:   BP 144/90 mmHg  Ht  (1.575 m)  Wt 188 lb 4 oz (85.39 kg)  BMI 34.42 kg/m2 NAD. Alert, oriented. Lungs clear. Heart regular rate rhythm.  Assessment:  Problem List Items Addressed This Visit      Other   Anxiety and depression   Encounter for long-term use of opiate analgesic - Primary   Relevant Orders   Hepatic function panel   Basic metabolic panel   Insomnia    Other Visit Diagnoses    Screening, lipid        Relevant Orders    Lipid panel    Other fatigue        Relevant Orders    TSH    Vit D  25 hydroxy (rtn osteoporosis monitoring)      Plan:  Meds ordered this encounter  Medications  . chlorzoxazone (PARAFON) 500 MG tablet    Sig: Take 1 tablet (500 mg total) by mouth 3 (three) times daily as needed for muscle spasms.    Dispense:  30 tablet    Refill:  0    Order Specific Question:  Supervising Trejon Duford    Answer:  Merlyn Albert [[id number]]  . citalopram (CELEXA) 40 MG tablet    Sig: TAKE ONE TABLET BY MOUTH ONCE  DAILY    Dispense:  90 tablet    Refill:  1    Order Specific Question:  Supervising Valyn Latchford    Answer:  Merlyn Albert [[id number]]  . traZODone (DESYREL) 100 MG tablet    Sig: Take 1 tablet (100 mg total) by mouth at bedtime.    Dispense:  90 tablet    Refill:  1    Order Specific Question:  Supervising Leif Loflin    Answer:  Merlyn Albert [[id number]]  . hydrochlorothiazide (HYDRODIURIL) 25 MG tablet    Sig: TAKE ONE TABLET BY MOUTH ONCE DAILY    Dispense:  90 tablet    Refill:  1    Order Specific Question:  Supervising Halima Fogal    Answer:  Merlyn Albert [[id number]]  . DISCONTD: HYDROcodone-acetaminophen (NORCO/VICODIN) 5-325 MG tablet    Sig: Take 1 tablet by mouth every 4 (four) hours as needed.    Dispense:  30 tablet    Refill:  0    Order Specific Question:  Supervising Camera Krienke    Answer:  Merlyn Albert [[id number]]  . DISCONTD: HYDROcodone-acetaminophen (NORCO/VICODIN) 5-325 MG tablet    Sig: Take 1 tablet by mouth every 4 (four) hours as needed.  Dispense:  30 tablet    Refill:  0    May fill 30 days from 12/04/14    Order Specific Question:  Supervising Nihira Puello    Answer:  Merlyn Albert [[id number]]  . HYDROcodone-acetaminophen (NORCO/VICODIN) 5-325 MG tablet    Sig: Take 1 tablet by mouth every 4 (four) hours as needed.    Dispense:  30 tablet    Refill:  0    May fill 60 days from 12/04/14    Order Specific Question:  Supervising Lloyde Ludlam    Answer:  Merlyn Albert [[id number]]   Strongly recommend preventive health physical. Routine lab work ordered today. Defers flu vaccine but recommend that she reconsider due to a young grandchild in the home. Return in about 3 months (around 03/05/2015) for recheck.

## 2014-12-05 ENCOUNTER — Encounter: Payer: Self-pay | Admitting: Nurse Practitioner

## 2015-03-04 ENCOUNTER — Encounter: Payer: PRIVATE HEALTH INSURANCE | Admitting: Nurse Practitioner

## 2015-03-12 ENCOUNTER — Ambulatory Visit (INDEPENDENT_AMBULATORY_CARE_PROVIDER_SITE_OTHER): Payer: PRIVATE HEALTH INSURANCE | Admitting: Nurse Practitioner

## 2015-03-12 ENCOUNTER — Encounter: Payer: Self-pay | Admitting: Nurse Practitioner

## 2015-03-12 VITALS — BP 122/78 | Ht 62.0 in | Wt 181.4 lb

## 2015-03-12 DIAGNOSIS — Z79891 Long term (current) use of opiate analgesic: Secondary | ICD-10-CM

## 2015-03-12 MED ORDER — HYDROCODONE-ACETAMINOPHEN 5-325 MG PO TABS: 1.0000 | ORAL_TABLET | ORAL | Status: DC | PRN

## 2015-03-12 NOTE — Progress Notes (Signed)
Subjective:  This patient was seen today for chronic pain  The medication list was reviewed and updated.   -Compliance with medication: yes  - Number patient states they take daily: as needed  -when was the last dose patient took? Tuesday; 3 days ago  The patient was advised the importance of maintaining medication and not using illegal substances with these.  Refills needed: yes  The patient was educated that we can provide 3 monthly scripts for their medication, it is their responsibility to follow the instructions.  Side effects or complications from medications: none   Patient is aware that pain medications are meant to minimize the severity of the pain to allow their pain levels to improve to allow for better function. They are aware of that pain medications cannot totally remove their pain.  Due for UDT ( at least once per year) : 08/31/14       Objective:   BP 122/78 mmHg  Ht 5\' 2"  (1.575 m)  Wt 181 lb 6.4 oz (82.283 kg)  BMI 33.17 kg/m2 NAD. Alert, oriented. Lungs clear. Heart RRR.   Assessment:  Problem List Items Addressed This Visit      Other   Encounter for long-term use of opiate analgesic - Primary     Plan:  Meds ordered this encounter  Medications  . DISCONTD: HYDROcodone-acetaminophen (NORCO/VICODIN) 5-325 MG tablet    Sig: Take 1 tablet by mouth every 4 (four) hours as needed.    Dispense:  30 tablet    Refill:  0    May fill 60 days from 03/12/15    Order Specific Question:  Supervising Provider    Answer:  Merlyn AlbertLUKING, WILLIAM S [[id number]]  . DISCONTD: HYDROcodone-acetaminophen (NORCO/VICODIN) 5-325 MG tablet    Sig: Take 1 tablet by mouth every 4 (four) hours as needed.    Dispense:  30 tablet    Refill:  0    May fill 30 days from 03/12/15    Order Specific Question:  Supervising Provider    Answer:  Merlyn AlbertLUKING, WILLIAM S [[id number]]  . HYDROcodone-acetaminophen (NORCO/VICODIN) 5-325 MG tablet    Sig: Take 1 tablet by mouth every 4 (four) hours as needed.   Dispense:  30 tablet    Refill:  0    Order Specific Question:  Supervising Provider    Answer:  Merlyn AlbertLUKING, WILLIAM S [[id number]]   Return in about 3 months (around 06/10/2015) for recheck. Encouraged preventive health physical.

## 2015-06-10 ENCOUNTER — Ambulatory Visit: Payer: PRIVATE HEALTH INSURANCE | Admitting: Nurse Practitioner

## 2015-06-25 ENCOUNTER — Ambulatory Visit (INDEPENDENT_AMBULATORY_CARE_PROVIDER_SITE_OTHER): Payer: PRIVATE HEALTH INSURANCE | Admitting: Nurse Practitioner

## 2015-06-25 ENCOUNTER — Encounter: Payer: Self-pay | Admitting: Nurse Practitioner

## 2015-06-25 VITALS — BP 112/74 | Ht 62.0 in | Wt 188.0 lb

## 2015-06-25 DIAGNOSIS — Z79891 Long term (current) use of opiate analgesic: Secondary | ICD-10-CM | POA: Diagnosis not present

## 2015-06-25 DIAGNOSIS — F419 Anxiety disorder, unspecified: Secondary | ICD-10-CM

## 2015-06-25 DIAGNOSIS — F329 Major depressive disorder, single episode, unspecified: Secondary | ICD-10-CM

## 2015-06-25 DIAGNOSIS — F32A Depression, unspecified: Secondary | ICD-10-CM

## 2015-06-25 DIAGNOSIS — G4700 Insomnia, unspecified: Secondary | ICD-10-CM | POA: Diagnosis not present

## 2015-06-25 DIAGNOSIS — F418 Other specified anxiety disorders: Secondary | ICD-10-CM

## 2015-06-25 MED ORDER — CITALOPRAM HYDROBROMIDE 40 MG PO TABS: ORAL_TABLET | ORAL | Status: DC

## 2015-06-25 MED ORDER — HYDROCODONE-ACETAMINOPHEN 5-325 MG PO TABS: 1.0000 | ORAL_TABLET | ORAL | Status: DC | PRN

## 2015-06-25 MED ORDER — TRAZODONE HCL 100 MG PO TABS: 100.0000 mg | ORAL_TABLET | Freq: Every day | ORAL | Status: DC

## 2015-06-26 ENCOUNTER — Encounter: Payer: Self-pay | Admitting: Nurse Practitioner

## 2015-06-26 NOTE — Progress Notes (Signed)
Subjective:  This patient was seen today for chronic pain  The medication list was reviewed and updated.   -Compliance with medication:  yes  - Number patient states they take daily: only on prn basis; 2-3 days per week for headache or back pain  -when was the last dose patient took? Last dose was one day last week  The patient was advised the importance of maintaining medication and not using illegal substances with these.  Refills needed: yes  The patient was educated that we can provide 3 monthly scripts for their medication, it is their responsibility to follow the instructions.  Side effects or complications from medications: none  Patient is aware that pain medications are meant to minimize the severity of the pain to allow their pain levels to improve to allow for better function. They are aware of that pain medications cannot totally remove their pain.  Due for UDT ( at least once per year) : 08/31/14; [place] CSR reviewed.   Has gained some weight which she related to eating too many sweets and potatoes. Doing well on Celexa and Trazodone. Sleeping well.      Objective:   BP 112/74 mmHg  Ht 5\' 2"  (1.575 m)  Wt 188 lb (85.276 kg)  BMI 34.38 kg/m2 NAD. Alert, oriented. Lungs clear. Heart RRR.   Assessment:  Problem List Items Addressed This Visit      Other   Anxiety and depression   Encounter for long-term use of opiate analgesic   Insomnia - Primary     Plan:  Meds ordered this encounter  Medications  . citalopram (CELEXA) 40 MG tablet    Sig: TAKE ONE TABLET BY MOUTH ONCE DAILY    Dispense:  90 tablet    Refill:  1    Order Specific Question:  Supervising Provider    Answer:  Merlyn AlbertLUKING, WILLIAM S [[id number]]  . traZODone (DESYREL) 100 MG tablet    Sig: Take 1 tablet (100 mg total) by mouth at bedtime.    Dispense:  90 tablet    Refill:  1    Order Specific Question:  Supervising Provider    Answer:  Merlyn AlbertLUKING, WILLIAM S [[id number]]  . DISCONTD: HYDROcodone-acetaminophen  (NORCO/VICODIN) 5-325 MG tablet    Sig: Take 1 tablet by mouth every 4 (four) hours as needed.    Dispense:  30 tablet    Refill:  0    Order Specific Question:  Supervising Provider    Answer:  Merlyn AlbertLUKING, WILLIAM S [[id number]]  . DISCONTD: HYDROcodone-acetaminophen (NORCO/VICODIN) 5-325 MG tablet    Sig: Take 1 tablet by mouth every 4 (four) hours as needed.    Dispense:  30 tablet    Refill:  0    May fill 30 days from 06/25/15    Order Specific Question:  Supervising Provider    Answer:  Merlyn AlbertLUKING, WILLIAM S [[id number]]  . HYDROcodone-acetaminophen (NORCO/VICODIN) 5-325 MG tablet    Sig: Take 1 tablet by mouth every 4 (four) hours as needed.    Dispense:  30 tablet    Refill:  0    May fill 60 days from 06/25/15    Order Specific Question:  Supervising Provider    Answer:  Merlyn AlbertLUKING, WILLIAM S [[id number]]   Recommend preventive health physical.  Return in about 3 months (around 09/24/2015) for recheck.

## 2015-07-10 ENCOUNTER — Other Ambulatory Visit: Payer: Self-pay | Admitting: Nurse Practitioner

## 2015-09-24 ENCOUNTER — Encounter: Payer: Self-pay | Admitting: Nurse Practitioner

## 2015-09-24 ENCOUNTER — Ambulatory Visit (INDEPENDENT_AMBULATORY_CARE_PROVIDER_SITE_OTHER): Payer: PRIVATE HEALTH INSURANCE | Admitting: Nurse Practitioner

## 2015-09-24 VITALS — BP 128/84 | Ht 62.0 in | Wt 191.8 lb

## 2015-09-24 DIAGNOSIS — Z79891 Long term (current) use of opiate analgesic: Secondary | ICD-10-CM | POA: Diagnosis not present

## 2015-09-24 DIAGNOSIS — F418 Other specified anxiety disorders: Secondary | ICD-10-CM | POA: Diagnosis not present

## 2015-09-24 DIAGNOSIS — F32A Depression, unspecified: Secondary | ICD-10-CM

## 2015-09-24 DIAGNOSIS — F419 Anxiety disorder, unspecified: Secondary | ICD-10-CM

## 2015-09-24 DIAGNOSIS — J300 Vasomotor rhinitis: Secondary | ICD-10-CM

## 2015-09-24 DIAGNOSIS — F329 Major depressive disorder, single episode, unspecified: Secondary | ICD-10-CM

## 2015-09-24 MED ORDER — HYDROCODONE-ACETAMINOPHEN 5-325 MG PO TABS: 1.0000 | ORAL_TABLET | ORAL | Status: DC | PRN

## 2015-09-24 NOTE — Progress Notes (Signed)
Subjective:  This patient was seen today for chronic pain  The medication list was reviewed and updated.   -Compliance with medication: yes  - Number patient states they take daily: 0-2  -when was the last dose patient took? Last night  The patient was advised the importance of maintaining medication and not using illegal substances with these.  Refills needed: yes  The patient was educated that we can provide 3 monthly scripts for their medication, it is their responsibility to follow the instructions.  Side effects or complications from medications: none  Patient is aware that pain medications are meant to minimize the severity of the pain to allow their pain levels to improve to allow for better function. They are aware of that pain medications cannot totally remove their pain.  Due for UDT ( at least once per year) : today  [place] CSR reviewed Also c/o right ear pressure; no fever or cough; mild head congestion; clear mucus  Anxiety and depression stable on Celexa and Trazodone  Objective:   BP 128/84 mmHg  Ht 5\' 2"  (1.575 m)  Wt 191 lb 12.8 oz (87 kg)  BMI 35.07 kg/m2 NAD. Alert, oriented. TMs retracted bilat more on the right; no erythema. Pharynx clear. Neck supple with mild anterior adenopathy. Lungs clear. Heart RRR.   Assessment:  Problem List Items Addressed This Visit      Other   Anxiety and depression   Encounter for long-term use of opiate analgesic - Primary   Relevant Orders   ToxASSURE Select 13 (MW), Urine    Other Visit Diagnoses    Vasomotor rhinitis          Plan:  Meds ordered this encounter  Medications  . DISCONTD: HYDROcodone-acetaminophen (NORCO/VICODIN) 5-325 MG tablet    Sig: Take 1 tablet by mouth every 4 (four) hours as needed.    Dispense:  30 tablet    Refill:  0    May fill 60 days from 09/24/15    Order Specific Question:  Supervising Provider    Answer:  Merlyn AlbertLUKING, WILLIAM S [[id number]]  . DISCONTD: HYDROcodone-acetaminophen  (NORCO/VICODIN) 5-325 MG tablet    Sig: Take 1 tablet by mouth every 4 (four) hours as needed.    Dispense:  30 tablet    Refill:  0    May fill 30 days from 09/24/15    Order Specific Question:  Supervising Provider    Answer:  Merlyn AlbertLUKING, WILLIAM S [[id number]]  . HYDROcodone-acetaminophen (NORCO/VICODIN) 5-325 MG tablet    Sig: Take 1 tablet by mouth every 4 (four) hours as needed.    Dispense:  30 tablet    Refill:  0    Order Specific Question:  Supervising Provider    Answer:  Merlyn AlbertLUKING, WILLIAM S [[id number]]   Start OTC steroid nasal spray as directed. Call back if worsens or persists. Return in about 3 months (around 12/25/2015) for recheck.

## 2015-10-04 LAB — TOXASSURE SELECT 13 (MW), URINE: PDF: 0

## 2015-10-16 ENCOUNTER — Encounter: Payer: Self-pay | Admitting: Nurse Practitioner

## 2015-12-24 ENCOUNTER — Ambulatory Visit: Payer: PRIVATE HEALTH INSURANCE | Admitting: Nurse Practitioner

## 2016-02-04 ENCOUNTER — Other Ambulatory Visit: Payer: Self-pay | Admitting: Nurse Practitioner

## 2016-05-09 ENCOUNTER — Other Ambulatory Visit: Payer: Self-pay | Admitting: Nurse Practitioner

## 2016-05-09 ENCOUNTER — Other Ambulatory Visit: Payer: Self-pay | Admitting: Family Medicine

## 2016-05-09 NOTE — Telephone Encounter (Signed)
Patient last seen July 2017 

## 2016-05-09 NOTE — Telephone Encounter (Signed)
30 d only, needs f u visit

## 2016-06-08 ENCOUNTER — Telehealth: Payer: Self-pay | Admitting: Family Medicine

## 2016-06-08 ENCOUNTER — Other Ambulatory Visit: Payer: Self-pay | Admitting: Nurse Practitioner

## 2016-06-08 MED ORDER — TRAZODONE HCL 100 MG PO TABS: 100.0000 mg | ORAL_TABLET | Freq: Every day | ORAL | 0 refills | Status: DC

## 2016-06-08 MED ORDER — HYDROCHLOROTHIAZIDE 25 MG PO TABS: 25.0000 mg | ORAL_TABLET | Freq: Every day | ORAL | 0 refills | Status: DC

## 2016-06-08 MED ORDER — CITALOPRAM HYDROBROMIDE 40 MG PO TABS: 40.0000 mg | ORAL_TABLET | Freq: Every day | ORAL | 0 refills | Status: DC

## 2016-06-08 NOTE — Telephone Encounter (Signed)
Needs refills to hold her untill her appt on 06-23-16 with Eber Jones. [facility].  -citalopram (CELEXA) 40 MG tablet  -hydrochlorothiazide (HYDRODIURIL) 25 MG tablet -traZODone (DESYREL) 100 MG tablet

## 2016-06-08 NOTE — Telephone Encounter (Signed)
Done

## 2016-06-23 ENCOUNTER — Ambulatory Visit (INDEPENDENT_AMBULATORY_CARE_PROVIDER_SITE_OTHER): Payer: PRIVATE HEALTH INSURANCE | Admitting: Nurse Practitioner

## 2016-06-23 ENCOUNTER — Encounter: Payer: Self-pay | Admitting: Nurse Practitioner

## 2016-06-23 VITALS — BP 130/82 | Temp 98.1°F | Ht 62.0 in | Wt 177.8 lb

## 2016-06-23 DIAGNOSIS — Z79891 Long term (current) use of opiate analgesic: Secondary | ICD-10-CM

## 2016-06-23 DIAGNOSIS — F329 Major depressive disorder, single episode, unspecified: Secondary | ICD-10-CM

## 2016-06-23 DIAGNOSIS — G4700 Insomnia, unspecified: Secondary | ICD-10-CM

## 2016-06-23 DIAGNOSIS — F419 Anxiety disorder, unspecified: Secondary | ICD-10-CM | POA: Diagnosis not present

## 2016-06-23 DIAGNOSIS — F32A Depression, unspecified: Secondary | ICD-10-CM

## 2016-06-23 MED ORDER — HYDROCODONE-ACETAMINOPHEN 5-325 MG PO TABS: 1.0000 | ORAL_TABLET | ORAL | 0 refills | Status: DC | PRN

## 2016-06-23 MED ORDER — HYDROCHLOROTHIAZIDE 25 MG PO TABS: 25.0000 mg | ORAL_TABLET | Freq: Every day | ORAL | 2 refills | Status: DC

## 2016-06-23 MED ORDER — CHLORZOXAZONE 500 MG PO TABS: 500.0000 mg | ORAL_TABLET | Freq: Three times a day (TID) | ORAL | 2 refills | Status: DC | PRN

## 2016-06-23 MED ORDER — CITALOPRAM HYDROBROMIDE 40 MG PO TABS: 40.0000 mg | ORAL_TABLET | Freq: Every day | ORAL | 2 refills | Status: DC

## 2016-06-23 MED ORDER — MELOXICAM 15 MG PO TABS: 15.0000 mg | ORAL_TABLET | Freq: Every day | ORAL | 2 refills | Status: DC

## 2016-06-23 MED ORDER — TRAZODONE HCL 100 MG PO TABS: ORAL_TABLET | ORAL | 2 refills | Status: DC

## 2016-06-23 NOTE — Progress Notes (Signed)
Subjective: This patient was seen today for chronic pain  The medication list was reviewed and updated.   -Compliance with medication: not on now  - Number patient states they take daily: once a day as needed; no more than 30 per month  -when was the last dose patient took?  Since August 2017  The patient was advised the importance of maintaining medication and not using illegal substances with these.  Refills needed: yes  The patient was educated that we can provide 3 monthly scripts for their medication, it is their responsibility to follow the instructions.  Side effects or complications from medications: none  Patient is aware that pain medications are meant to minimize the severity of the pain to allow their pain levels to improve to allow for better function. They are aware of that pain medications cannot totally remove their pain.  Due for UDT ( at least once per year) : UTD  Also takes muscle relaxant; not currently on NSAID.  [facility]SR reviewed.  Anxiety and depression stable on Celexa. Has a new job which is more active and less stress. Has more pain on the days she works. Trazodone working well for sleep.   Objective: NAD. Alert, oriented. Lungs clear. Heart RRR.   Assessment:  Problem List Items Addressed This Visit      Other   Anxiety and depression   Encounter for long-term use of opiate analgesic - Primary   Insomnia     Plan:  Meds ordered this encounter  Medications  . chlorzoxazone (PARAFON) 500 MG tablet    Sig: Take 1 tablet (500 mg total) by mouth 3 (three) times daily as needed for muscle spasms.    Dispense:  30 tablet    Refill:  2    Order Specific Question:   Supervising Provider    Answer:   Merlyn Albert [[id number]]  . citalopram (CELEXA) 40 MG tablet    Sig: Take 1 tablet (40 mg total) by mouth daily.    Dispense:  30 tablet    Refill:  2    Order Specific Question:   Supervising Provider    Answer:   Merlyn Albert [[id number]]  .  hydrochlorothiazide (HYDRODIURIL) 25 MG tablet    Sig: Take 1 tablet (25 mg total) by mouth daily.    Dispense:  30 tablet    Refill:  2    Order Specific Question:   Supervising Provider    Answer:   Merlyn Albert [[id number]]  . traZODone (DESYREL) 100 MG tablet    Sig: One po qd    Dispense:  30 tablet    Refill:  2    Order Specific Question:   Supervising Provider    Answer:   Merlyn Albert [[id number]]  . DISCONTD: HYDROcodone-acetaminophen (NORCO/VICODIN) 5-325 MG tablet    Sig: Take 1 tablet by mouth every 4 (four) hours as needed.    Dispense:  30 tablet    Refill:  0    Order Specific Question:   Supervising Provider    Answer:   Merlyn Albert [[id number]]  . DISCONTD: HYDROcodone-acetaminophen (NORCO/VICODIN) 5-325 MG tablet    Sig: Take 1 tablet by mouth every 4 (four) hours as needed.    Dispense:  30 tablet    Refill:  0    May fill 30 days from 06/23/16    Order Specific Question:   Supervising Provider    Answer:   Merlyn Albert [[id number]]  . HYDROcodone-acetaminophen (NORCO/VICODIN)  5-325 MG tablet    Sig: Take 1 tablet by mouth every 4 (four) hours as needed.    Dispense:  30 tablet    Refill:  0    May fill 60 days from 06/23/16    Order Specific Question:   Supervising Provider    Answer:   Merlyn Albert [[id number]]  . meloxicam (MOBIC) 15 MG tablet    Sig: Take 1 tablet (15 mg total) by mouth daily. Prn pain    Dispense:  30 tablet    Refill:  2    Order Specific Question:   Supervising Provider    Answer:   Merlyn Albert [[id number]]   Add meloxicam to regimen. Strongly encouraged preventive health physical.  Return in about 3 months (around 09/22/2016) for pain management.

## 2016-06-24 ENCOUNTER — Encounter: Payer: Self-pay | Admitting: Nurse Practitioner

## 2016-09-22 ENCOUNTER — Ambulatory Visit: Payer: PRIVATE HEALTH INSURANCE | Admitting: Nurse Practitioner

## 2016-10-22 ENCOUNTER — Other Ambulatory Visit: Payer: Self-pay | Admitting: Nurse Practitioner

## 2016-10-23 NOTE — Telephone Encounter (Signed)
Ok plus one ref for both

## 2016-11-29 ENCOUNTER — Other Ambulatory Visit: Payer: Self-pay | Admitting: Nurse Practitioner

## 2016-12-29 ENCOUNTER — Other Ambulatory Visit: Payer: Self-pay | Admitting: Family Medicine

## 2017-01-31 ENCOUNTER — Other Ambulatory Visit: Payer: Self-pay | Admitting: Nurse Practitioner

## 2017-03-02 ENCOUNTER — Other Ambulatory Visit: Payer: Self-pay | Admitting: Nurse Practitioner

## 2017-04-04 ENCOUNTER — Other Ambulatory Visit: Payer: Self-pay | Admitting: Nurse Practitioner

## 2017-05-08 ENCOUNTER — Other Ambulatory Visit: Payer: Self-pay | Admitting: Nurse Practitioner

## 2017-06-07 ENCOUNTER — Other Ambulatory Visit: Payer: Self-pay | Admitting: Nurse Practitioner

## 2017-07-09 ENCOUNTER — Other Ambulatory Visit: Payer: Self-pay | Admitting: Nurse Practitioner

## 2017-08-07 ENCOUNTER — Other Ambulatory Visit: Payer: Self-pay | Admitting: Nurse Practitioner

## 2017-09-09 ENCOUNTER — Other Ambulatory Visit: Payer: Self-pay | Admitting: Family Medicine

## 2017-09-10 NOTE — Telephone Encounter (Signed)
15 days worth both absolutely last ref before being seen again

## 2017-10-01 ENCOUNTER — Other Ambulatory Visit: Payer: Self-pay | Admitting: Family Medicine

## 2017-10-01 NOTE — Telephone Encounter (Signed)
Refused prescriptions needs follow-up office visit

## 2017-12-06 ENCOUNTER — Ambulatory Visit: Payer: PRIVATE HEALTH INSURANCE | Admitting: Family Medicine

## 2017-12-06 ENCOUNTER — Encounter: Payer: Self-pay | Admitting: Family Medicine

## 2017-12-06 VITALS — BP 122/82 | Ht 62.0 in | Wt 162.4 lb

## 2017-12-06 DIAGNOSIS — F329 Major depressive disorder, single episode, unspecified: Secondary | ICD-10-CM

## 2017-12-06 DIAGNOSIS — G4709 Other insomnia: Secondary | ICD-10-CM | POA: Diagnosis not present

## 2017-12-06 DIAGNOSIS — M545 Low back pain, unspecified: Secondary | ICD-10-CM

## 2017-12-06 DIAGNOSIS — Z79891 Long term (current) use of opiate analgesic: Secondary | ICD-10-CM | POA: Diagnosis not present

## 2017-12-06 DIAGNOSIS — G8929 Other chronic pain: Secondary | ICD-10-CM

## 2017-12-06 DIAGNOSIS — F419 Anxiety disorder, unspecified: Secondary | ICD-10-CM | POA: Diagnosis not present

## 2017-12-06 DIAGNOSIS — M549 Dorsalgia, unspecified: Secondary | ICD-10-CM | POA: Insufficient documentation

## 2017-12-06 DIAGNOSIS — F32A Depression, unspecified: Secondary | ICD-10-CM

## 2017-12-06 MED ORDER — HYDROCODONE-ACETAMINOPHEN 5-325 MG PO TABS: ORAL_TABLET | ORAL | 0 refills | Status: DC

## 2017-12-06 MED ORDER — CITALOPRAM HYDROBROMIDE 40 MG PO TABS: ORAL_TABLET | ORAL | 5 refills | Status: DC

## 2017-12-06 MED ORDER — TRAZODONE HCL 100 MG PO TABS: ORAL_TABLET | ORAL | 5 refills | Status: DC

## 2017-12-06 NOTE — Progress Notes (Addendum)
Subjective:    Patient ID: Karen Heath, female    DOB: 1960/06/09, 57 y.o.   MRN: [id number]  HPI  Patient arrives for a follow up on depression. Patient would like refill on Celexa and trazodone. Patient would also like to see if she can restart her Hydrocodone for back pain- last got Hydrocodone here 06/2016.  Pt ran out of celexa and feels like she is a lot more moody and irritable. Admits to smoking marijuana. Denies SI/HI. Denies any other illegal drug use.   Reports hx of difficulty sleeping, has been out of trazodone as well. States slept well when taking trazodone.  States without it has difficulty falling and staying asleep. Reports drinking about 4 diet cokes up until bedtime. Has a set bedtime and awake time. Denies any physical activity outside of work.   Reports continued back pain without hydrocodone has been out for about 3 months, has been taking 2 aleve bid about 3 x per week, which is somewhat helpful. 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 States norco has been helpful in the past in alleviating her pain enough to help increase her functional status.  Also reports some pain to right 2nd and 3rd knuckles, intermittently, described as a "hot poker" at times, has been ongoing for the last year.   Review of Systems  Constitutional: Negative for unexpected weight change.  Respiratory: Negative for shortness of breath.   Cardiovascular: Negative for chest pain.  Musculoskeletal: Positive for arthralgias and back pain.  Psychiatric/Behavioral: Positive for dysphoric mood and sleep disturbance. Negative for suicidal ideas.       Objective:   Physical Exam  Constitutional: She is oriented to person, place, and time. She appears well-developed and well-nourished. No distress.  HENT:  Head: Normocephalic and atraumatic.  Neck: Neck supple.  Cardiovascular: Normal rate, regular rhythm and normal heart sounds.  No murmur  heard. Pulmonary/Chest: Effort normal and breath sounds normal. No respiratory distress.  Musculoskeletal: She exhibits tenderness (to right 2nd and 3rd MCP joints). She exhibits no edema or deformity.  Neurological: She is alert and oriented to person, place, and time. Coordination normal.  Skin: Skin is warm and dry.  Psychiatric: She has a normal mood and affect.  Nursing note and vitals reviewed.     Assessment & Plan:  1. Encounter for long-term use of opiate analgesic Pt would like to restart on hydrocodone, reports good results when taking, would take only prn 1 tablet after work. Not taking daily, no hx of abuse. PMP checked. Reasonable to refill.  Had her sign pain contract and understands a UDT will be needed at next visit, and encouraged to keep every 3 month appt.   2. Anxiety and depression  Was under good control when taking celexa. Reports since being off has been more moody, but denies SI/HI.  Will refill, instructed to start by taking 1/2 tablet daily for 1 week, then may increase to 1 tablet daily.  3. Other insomnia  Was under good control with trazodone at nighttime. Encouraged her to cut back on her caffeine intake, and especially cut off caffeine use in early afternoon. Will refill trazodone.   Pain to her MCP joints, likely r/t inflammation from her work. She may take aleve prn for this, if worsens will f/u. Strongly encouraged her to schedule a wellness exam as she is overdue for much of her screenings in order to get these done. We may do this  at her next 3 month f/u if she prefers.  Care plan discussed with and agreed upon with Dr. Brett Canales today, who was also a part of this visit.   25 minutes was spent with the patient.  This statement verifies that 25 minutes was indeed spent with the patient.  More than 50% of this visit-total duration of the visit-was spent in counseling and coordination of care. The issues that the patient came in for today as reflected in the  diagnosis (s) please refer to documentation for further details.

## 2018-03-11 ENCOUNTER — Encounter: Payer: Self-pay | Admitting: Family Medicine

## 2018-03-11 ENCOUNTER — Ambulatory Visit: Payer: BLUE CROSS/BLUE SHIELD | Admitting: Family Medicine

## 2018-03-11 VITALS — BP 136/86 | Ht 62.0 in | Wt 164.4 lb

## 2018-03-11 DIAGNOSIS — F329 Major depressive disorder, single episode, unspecified: Secondary | ICD-10-CM

## 2018-03-11 DIAGNOSIS — M545 Low back pain: Secondary | ICD-10-CM

## 2018-03-11 DIAGNOSIS — G8929 Other chronic pain: Secondary | ICD-10-CM

## 2018-03-11 DIAGNOSIS — F419 Anxiety disorder, unspecified: Secondary | ICD-10-CM | POA: Diagnosis not present

## 2018-03-11 DIAGNOSIS — F32A Depression, unspecified: Secondary | ICD-10-CM

## 2018-03-11 MED ORDER — HYDROCODONE-ACETAMINOPHEN 5-325 MG PO TABS: ORAL_TABLET | ORAL | 0 refills | Status: DC

## 2018-03-11 NOTE — Progress Notes (Signed)
   Subjective:    Patient ID: Karen Heath, female    DOB: 02-Oct-1960, 58 y.o.   MRN: [id number]  HPI  This patient was seen today for chronic pain  The medication list was reviewed and updated.   -Compliance with medication: yes  - Number patient states they take daily: 0-1  -when was the last dose patient took? Dec 29, 19  The patient was advised the importance of maintaining medication and not using illegal substances with these.  Here for refills and follow up  The patient was educated that we can provide 3 monthly scripts for their medication, it is their responsibility to follow the instructions.  Side effects or complications from medications: none  Patient is aware that pain medications are meant to minimize the severity of the pain to allow their pain levels to improve to allow for better function. They are aware of that pain medications cannot totally remove their pain.  Due for UDT ( at least once per year) :  Takes pain med primarily for back pain  Last filled 02/05/18   celexa has definitely helped   Uses hydrocod prn, depending on how bad it hurts   Patient compliant with insomnia medication. Generally takes most nights. No obvious morning drowsiness. Definitely helps patient sleep. Without it patient states would not get a good nights rest.   Review of Systems No headache, no major weight loss or weight gain, no chest pain no back pain abdominal pain no change in bowel habits complete ROS otherwise negative     Objective:   Physical Exam  Alert and oriented, vitals reviewed and stable, NAD ENT-TM's and ext canals WNL bilat via otoscopic exam Soft palate, tonsils and post pharynx WNL via oropharyngeal exam Neck-symmetric, no masses; thyroid nonpalpable and nontender Pulmonary-no tachypnea or accessory muscle use; Clear without wheezes via auscultation Card--no abnrml murmurs, rhythm reg and rate WNL Carotid pulses symmetric, without bruits       Assessment & Plan:  Impression chronic pain.  Discussed.  Tolerating medication well.  Helpful.  Uses only 1/day Impression: Chronic pain. Patient compliant with medication. No substantial side effects. [place] controlled substance registry reviewed to ensure compliance and proper use of medication. Patient aware goal of medicine is not complete resolution of pain but to control his symptoms to improve his functional capacity. Aware of potential adverse side effects  2.  Depression with element of anxiety  .  Clinically stable.  Discussed maintain same meds  Follow-up in 3 months

## 2018-04-15 ENCOUNTER — Telehealth: Payer: Self-pay

## 2018-04-15 NOTE — Telephone Encounter (Signed)
Left a message to r/c so I can inform that the Hydrocodone 5-325 was approved and she can call pharmacy to get it filled.

## 2018-04-15 NOTE — Telephone Encounter (Signed)
Patient Hydrocodone 5-325 approved  From 04/12/2018-05/11/2018 per [employer].

## 2018-04-16 NOTE — Telephone Encounter (Signed)
Left message to return call 

## 2018-04-17 NOTE — Telephone Encounter (Signed)
Tried to contact patient; left message to return call. Contacted pharmacy to see if patient had picked the medication up. Pharmacy associate states that this med was filled on 04/11/2018 and picked up on 04/13/2018.

## 2018-04-19 ENCOUNTER — Telehealth: Payer: Self-pay | Admitting: Family Medicine

## 2018-04-19 NOTE — Telephone Encounter (Signed)
error 

## 2018-04-19 NOTE — Telephone Encounter (Signed)
Pt returned call and was informed that Hydrocodone was approved from 04/12/2018-05/11/2018. Pt has picked up script.

## 2018-06-10 ENCOUNTER — Ambulatory Visit: Payer: BLUE CROSS/BLUE SHIELD | Admitting: Family Medicine

## 2018-06-17 ENCOUNTER — Telehealth: Payer: Self-pay | Admitting: Family Medicine

## 2018-06-17 ENCOUNTER — Encounter: Payer: Self-pay | Admitting: Family Medicine

## 2018-06-17 ENCOUNTER — Other Ambulatory Visit: Payer: Self-pay

## 2018-06-17 ENCOUNTER — Ambulatory Visit (INDEPENDENT_AMBULATORY_CARE_PROVIDER_SITE_OTHER): Payer: BLUE CROSS/BLUE SHIELD | Admitting: Family Medicine

## 2018-06-17 DIAGNOSIS — G4709 Other insomnia: Secondary | ICD-10-CM | POA: Diagnosis not present

## 2018-06-17 DIAGNOSIS — F32A Depression, unspecified: Secondary | ICD-10-CM

## 2018-06-17 DIAGNOSIS — Z79891 Long term (current) use of opiate analgesic: Secondary | ICD-10-CM

## 2018-06-17 DIAGNOSIS — F419 Anxiety disorder, unspecified: Secondary | ICD-10-CM

## 2018-06-17 DIAGNOSIS — F329 Major depressive disorder, single episode, unspecified: Secondary | ICD-10-CM

## 2018-06-17 MED ORDER — CITALOPRAM HYDROBROMIDE 40 MG PO TABS: ORAL_TABLET | ORAL | 5 refills | Status: DC

## 2018-06-17 MED ORDER — HYDROCODONE-ACETAMINOPHEN 5-325 MG PO TABS: ORAL_TABLET | ORAL | 0 refills | Status: DC

## 2018-06-17 NOTE — Telephone Encounter (Signed)
error 

## 2018-06-17 NOTE — Progress Notes (Signed)
   Subjective:    Patient ID: Karen Heath, female    DOB: 1960/05/08, 58 y.o.   MRN: [id number] Audio plus video visit HPI  This patient was seen today for chronic pain  The medication list was reviewed and updated.   -Compliance with medication:yes  - Number patient states they take daily: 0-2 a day  -when was the last dose patient took? Wednesday  The patient was advised the importance of maintaining medication and not using illegal substances with these.  Here for refills and follow up  The patient was educated that we can provide 3 monthly scripts for their medication, it is their responsibility to follow the instructions.  Side effects or complications from medications: none  Patient is aware that pain medications are meant to minimize the severity of the pain to allow their pain levels to improve to allow for better function. They are aware of that pain medications cannot totally remove their pain.  Due for UDT ( at least once per year) : video visit  Virtual Visit via Video Note  I connected with Karen Heath on 06/17/18 at 11:00 AM EDT by a video enabled telemedicine application and verified that I am speaking with the correct person using two identifiers.   I discussed the limitations of evaluation and management by telemedicine and the availability of in person appointments. The patient expressed understanding and agreed to proceed.  History of Present Illness:    Observations/Objective:   Assessment and Plan:   Follow Up Instructions:    I discussed the assessment and treatment plan with the patient. The patient was provided an opportunity to ask questions and all were answered. The patient agreed with the plan and demonstrated an understanding of the instructions.   The patient was advised to call back or seek an in-person evaluation if the symptoms worsen or if the condition fails to improve as anticipated.  I provided 18 minutes of  non-face-to-face time during this encounter.    Patient states her depression and anxiety tendencies are overall stable despite the threat of the coronavirus.  Still working.      Review of Systems     Objective:   Physical Exam        Assessment & Plan:  Impression 1 chronic pain.  Use 1 pain pill daily to maintain rationale discussed  2.  Chronic depression anxiety clinically stable.  Maintain same meds.  Exercise encouraged  Follow-up in 3 months

## 2018-06-20 ENCOUNTER — Telehealth: Payer: Self-pay | Admitting: Family Medicine

## 2018-06-20 MED ORDER — TRAZODONE HCL 100 MG PO TABS: ORAL_TABLET | ORAL | 1 refills | Status: DC

## 2018-06-20 NOTE — Telephone Encounter (Signed)
Sent in 90 day supply with one refill to [facility] in [place]. Tried to call patient but no voicemail set up.

## 2018-06-20 NOTE — Telephone Encounter (Signed)
Please advise. Thank you

## 2018-06-20 NOTE — Telephone Encounter (Signed)
6 mo worth  

## 2018-06-20 NOTE — Telephone Encounter (Signed)
Pt did a virtual visit Monday and all medications where refilled except traZODone (DESYREL) 100 MG tablet. Please send to [id number] - [place], [place] - [address]

## 2018-06-21 NOTE — Telephone Encounter (Signed)
Tried to call no answer

## 2018-06-25 NOTE — Telephone Encounter (Signed)
Patient picked up medication per pharmacy. 

## 2018-09-18 ENCOUNTER — Encounter: Payer: Self-pay | Admitting: Family Medicine

## 2018-09-18 ENCOUNTER — Other Ambulatory Visit: Payer: Self-pay

## 2018-09-18 ENCOUNTER — Ambulatory Visit (INDEPENDENT_AMBULATORY_CARE_PROVIDER_SITE_OTHER): Payer: BC Managed Care – PPO | Admitting: Family Medicine

## 2018-09-18 DIAGNOSIS — Z79891 Long term (current) use of opiate analgesic: Secondary | ICD-10-CM

## 2018-09-18 MED ORDER — HYDROCODONE-ACETAMINOPHEN 5-325 MG PO TABS: ORAL_TABLET | ORAL | 0 refills | Status: DC

## 2018-09-18 MED ORDER — CITALOPRAM HYDROBROMIDE 40 MG PO TABS: ORAL_TABLET | ORAL | 5 refills | Status: DC

## 2018-09-18 MED ORDER — TRAZODONE HCL 100 MG PO TABS: ORAL_TABLET | ORAL | 1 refills | Status: DC

## 2018-09-18 NOTE — Progress Notes (Signed)
   Subjective:  Audio 1  Patient ID: Karen Heath, female    DOB: [birth year], 58 y.o.   MRN: [id number]  HPI This patient was seen today for chronic pain  The medication list was reviewed and updated.   -Compliance with medication: pt is taking Hydrocodone 5-325 1 qd prn pain   - Number patient states they take daily: sometimes 0; has taken up to 3 a day depending on her pain  -when was the last dose patient took? Monday evening   The patient was advised the importance of maintaining medication and not using illegal substances with these.  Here for refills and follow up  The patient was educated that we can provide 3 monthly scripts for their medication, it is their responsibility to follow the instructions.  Side effects or complications from medications: none  Patient is aware that pain medications are meant to minimize the severity of the pain to allow their pain levels to improve to allow for better function. They are aware of that pain medications cannot totally remove their pain.  Due for UDT ( at least once per year) :   Pt is also taking Celexa 40 mg for depression. Pt states she is doing well on the Celexa.  Virtual Visit via Video Note  I connected with Karen Heath on 09/18/18 at  9:00 AM EDT by a video enabled telemedicine application and verified that I am speaking with the correct person using two identifiers.  Location: Patient: home Provider: office   I discussed the limitations of evaluation and management by telemedicine and the availability of in person appointments. The patient expressed understanding and agreed to proceed.  History of Present Illness:    Observations/Objective:   Assessment and Plan:   Follow Up Instructions:    I discussed the assessment and treatment plan with the patient. The patient was provided an opportunity to ask questions and all were answered. The patient agreed with the plan and demonstrated an understanding  of the instructions.   The patient was advised to call back or seek an in-person evaluation if the symptoms worsen or if the condition fails to improve as anticipated.  I provided 18 minutes of non-face-to-face time during this encounter.   Vicente Males, LPN  Patient compliant with pain medication. Continues to experience the pain which led to initiation of analgesic intervention. No significant negative side effects. States definitely needs the pain medication to maintain current level of functioning. Does not receive controlled substance pain medication elsewhere.       Review of Systems No headache, no major weight loss or weight gain, no chest pain no back pain abdominal pain no change in bowel habits complete ROS otherwise negative     Objective:   Physical Exam   Virtual     Assessment & Plan:  Impression: Chronic pain. Patient compliant with medication. No substantial side effects. [place] controlled substance registry reviewed to ensure compliance and proper use of medication. Patient aware goal of medicine is not complete resolution of pain but to control his symptoms to improve his functional capacity. Aware of potential adverse side effects  Patient also notes a chronic anxiety with element depression is clinically stable.  To maintain all same meds meds refilled follow-up in 3 months

## 2018-12-20 ENCOUNTER — Other Ambulatory Visit: Payer: Self-pay

## 2018-12-20 ENCOUNTER — Encounter: Payer: Self-pay | Admitting: Family Medicine

## 2018-12-20 ENCOUNTER — Ambulatory Visit (INDEPENDENT_AMBULATORY_CARE_PROVIDER_SITE_OTHER): Payer: BC Managed Care – PPO | Admitting: Family Medicine

## 2018-12-20 DIAGNOSIS — F329 Major depressive disorder, single episode, unspecified: Secondary | ICD-10-CM | POA: Diagnosis not present

## 2018-12-20 DIAGNOSIS — G8929 Other chronic pain: Secondary | ICD-10-CM | POA: Diagnosis not present

## 2018-12-20 DIAGNOSIS — F419 Anxiety disorder, unspecified: Secondary | ICD-10-CM

## 2018-12-20 DIAGNOSIS — M545 Low back pain, unspecified: Secondary | ICD-10-CM

## 2018-12-20 DIAGNOSIS — F32A Depression, unspecified: Secondary | ICD-10-CM

## 2018-12-20 MED ORDER — HYDROCODONE-ACETAMINOPHEN 5-325 MG PO TABS: ORAL_TABLET | ORAL | 0 refills | Status: DC

## 2018-12-20 MED ORDER — CEFPROZIL 500 MG PO TABS: 500.0000 mg | ORAL_TABLET | Freq: Two times a day (BID) | ORAL | 0 refills | Status: DC

## 2018-12-20 MED ORDER — CITALOPRAM HYDROBROMIDE 40 MG PO TABS: ORAL_TABLET | ORAL | 1 refills | Status: DC

## 2018-12-20 MED ORDER — TRAZODONE HCL 100 MG PO TABS: ORAL_TABLET | ORAL | 1 refills | Status: DC

## 2018-12-20 NOTE — Progress Notes (Signed)
   Subjective:  Audio only  Patient ID: Karen Heath, female    DOB: 1961-02-03, 58 y.o.   MRN: [id number]  HPI This patient was seen today for chronic pain. Takes for back pain and headaches.   The medication list was reviewed and updated.   -Compliance with medication: yes  - Number patient states they take daily: one daily as needed  -when was the last dose patient took? 2 days ago  The patient was advised the importance of maintaining medication and not using illegal substances with these.  Here for refills and follow up  The patient was educated that we can provide 3 monthly scripts for their medication, it is their responsibility to follow the instructions.  Side effects or complications from medications: none  Patient is aware that pain medications are meant to minimize the severity of the pain to allow their pain levels to improve to allow for better function. They are aware of that pain medications cannot totally remove their pain.  Due for UDT ( at least once per year) : last one doen 09/24/2015  Sinus symptoms off and on for one week. Sinus pressure and ear pain. No fever.   Needs refill on trazodone.   Virtual Visit via Telephone Note  I connected with Karen Heath on 12/20/18 at  3:00 PM EDT by telephone and verified that I am speaking with the correct person using two identifiers.  Location: Patient: home Provider: office   I discussed the limitations, risks, security and privacy concerns of performing an evaluation and management service by telephone and the availability of in person appointments. I also discussed with the patient that there may be a patient responsible charge related to this service. The patient expressed understanding and agreed to proceed.   History of Present Illness:    Observations/Objective:   Assessment and Plan:   Follow Up Instructions:    I discussed the assessment and treatment plan with the patient. The patient  was provided an opportunity to ask questions and all were answered. The patient agreed with the plan and demonstrated an understanding of the instructions.   The patient was advised to call back or seek an in-person evaluation if the symptoms worsen or if the condition fails to improve as anticipated.  I provided 17 minutes of non-face-to-face time during this encounter.     Patient compliant with pain medication. Continues to experience the pain which led to initiation of analgesic intervention. No significant negative side effects. States definitely needs the pain medication to maintain current level of functioning. Does not receive controlled substance pain medication elsewhere.       Review of Systems No headache, no major weight loss or weight gain, no chest pain no back pain abdominal pain no change in bowel habits complete ROS otherwise negative     Objective:   Physical Exam   Virtual     Assessment & Plan:  Impression: Chronic pain. Patient compliant with medication. No substantial side effects. [place] controlled substance registry reviewed to ensure compliance and proper use of medication. Patient aware goal of medicine is not complete resolution of pain but to control his symptoms to improve his functional capacity. Aware of potential adverse side effects  Depression/anxiety/insomnia overall stable on medications pain meds refilled.  Exercise encouraged

## 2019-01-21 DIAGNOSIS — Z23 Encounter for immunization: Secondary | ICD-10-CM | POA: Diagnosis not present

## 2019-03-18 ENCOUNTER — Ambulatory Visit (INDEPENDENT_AMBULATORY_CARE_PROVIDER_SITE_OTHER): Payer: BLUE CROSS/BLUE SHIELD | Admitting: Family Medicine

## 2019-03-18 ENCOUNTER — Other Ambulatory Visit: Payer: Self-pay

## 2019-03-18 DIAGNOSIS — J310 Chronic rhinitis: Secondary | ICD-10-CM | POA: Diagnosis not present

## 2019-03-18 DIAGNOSIS — G8929 Other chronic pain: Secondary | ICD-10-CM

## 2019-03-18 DIAGNOSIS — J329 Chronic sinusitis, unspecified: Secondary | ICD-10-CM | POA: Diagnosis not present

## 2019-03-18 DIAGNOSIS — M545 Low back pain: Secondary | ICD-10-CM

## 2019-03-18 MED ORDER — HYDROCODONE-ACETAMINOPHEN 5-325 MG PO TABS: ORAL_TABLET | ORAL | 0 refills | Status: DC

## 2019-03-18 MED ORDER — CEFDINIR 300 MG PO CAPS: 300.0000 mg | ORAL_CAPSULE | Freq: Two times a day (BID) | ORAL | 0 refills | Status: DC

## 2019-03-18 NOTE — Progress Notes (Signed)
   Subjective:    Patient ID: Karen Heath, female    DOB: [birth year], 59 y.o.   MRN: [id number]  HPI  This patient was seen today for chronic pain  The medication list was reviewed and updated.   -Compliance with medication: yes  - Number patient states they take daily: one  -when was the last dose patient took? yesterday  The patient was advised the importance of maintaining medication and not using illegal substances with these.  Here for refills and follow up  The patient was educated that we can provide 3 monthly scripts for their medication, it is their responsibility to follow the instructions.  Side effects or complications from medications: none  Patient is aware that pain medications are meant to minimize the severity of the pain to allow their pain levels to improve to allow for better function. They are aware of that pain medications cannot totally remove their pain.   Patient feels like she has a sinus infection- symptoms started on Sunday  Virtual Visit via Video Note  I connected with Karen Heath on 03/18/19 at  9:00 AM EST by a video enabled telemedicine application and verified that I am speaking with the correct person using two identifiers.  Location: Patient: home Provider: office   I discussed the limitations of evaluation and management by telemedicine and the availability of in person appointments. The patient expressed understanding and agreed to proceed.  History of Present Illness:    Observations/Objective:   Assessment and Plan:   Follow Up Instructions:    I discussed the assessment and treatment plan with the patient. The patient was provided an opportunity to ask questions and all were answered. The patient agreed with the plan and demonstrated an understanding of the instructions.   The patient was advised to call back or seek an in-person evaluation if the symptoms worsen or if the condition fails to improve as  anticipated.  I provided 20 minutes of non-face-to-face time during this encounter.    Ears are tingling  Stopped up in the nasal passages   Nasal congestion  No one sick around her   No sic     Review of Systems No vomiting no diarrhea no fevers    Objective:   Physical Exam  Virtual      Assessment & Plan:  Impression chronic pain  Impression: Chronic pain. Patient compliant with medication. No substantial side effects. [place] controlled substance registry reviewed to ensure compliance and proper use of medication. Patient aware goal of medicine is not complete resolution of pain but to control his symptoms to improve his functional capacity. Aware of potential adverse side effects   2.  Potential rhinosinusitis.  4 days congestion.  Gunky nose.  Primarily nasal passages.  No chest pain no shortness of breath no sore throat.  Potential for COVID-19 discussed.  Will cover with antibiotics strongly requested patient tested for COVID-19 she agreed to do this

## 2019-03-19 ENCOUNTER — Encounter: Payer: Self-pay | Admitting: Family Medicine

## 2019-04-09 ENCOUNTER — Encounter: Payer: Self-pay | Admitting: Family Medicine

## 2019-05-09 ENCOUNTER — Ambulatory Visit: Payer: Self-pay | Attending: Internal Medicine

## 2019-05-09 DIAGNOSIS — Z23 Encounter for immunization: Secondary | ICD-10-CM | POA: Insufficient documentation

## 2019-05-09 NOTE — Progress Notes (Signed)
   Covid-19 Vaccination Clinic  Name:  HAISLEY ARENS    MRN: [id number] DOB: [birth year]  05/09/2019  Ms. Pomerleau was observed post Covid-19 immunization for 15 minutes without incident. She was provided with Vaccine Information Sheet and instruction to access the V-Safe system.   Ms. Cuadras was instructed to call 911 with any severe reactions post vaccine: Marland Kitchen Difficulty breathing  . Swelling of face and throat  . A fast heartbeat  . A bad rash all over body  . Dizziness and weakness   Immunizations Administered    Name Date Dose VIS Date Route   Moderna COVID-19 Vaccine 05/09/2019  9:34 AM 0.5 mL 02/04/2019 Intramuscular   Manufacturer: Moderna   Lot: [id number]   NDC: 63868-548-83

## 2019-06-10 ENCOUNTER — Ambulatory Visit: Payer: Self-pay | Attending: Internal Medicine

## 2019-06-10 DIAGNOSIS — Z23 Encounter for immunization: Secondary | ICD-10-CM

## 2019-06-10 NOTE — Progress Notes (Signed)
   Covid-19 Vaccination Clinic  Name:  Karen Heath    MRN: [id number] DOB: [birth year]  06/10/2019   Ms. Karen Heath was observed post Covid-19 immunization for 15 minutes without incident. She was provided with Vaccine Information Sheet and instruction to access the V-Safe system.   Ms. Karen Heath was instructed to call 911 with any severe reactions post vaccine: Marland Kitchen Difficulty breathing  . Swelling of face and throat  . A fast heartbeat  . A bad rash all over body  . Dizziness and weakness   Immunizations Administered    Name Date Dose VIS Date Route   Moderna COVID-19 Vaccine 06/10/2019 10:07 AM 0.5 mL 02/04/2019 Intramuscular   Manufacturer: Moderna   Lot: [id number]   NDC: 94496-759-16

## 2019-06-25 ENCOUNTER — Other Ambulatory Visit: Payer: Self-pay

## 2019-06-25 ENCOUNTER — Telehealth (INDEPENDENT_AMBULATORY_CARE_PROVIDER_SITE_OTHER): Payer: BLUE CROSS/BLUE SHIELD | Admitting: Family Medicine

## 2019-06-25 ENCOUNTER — Telehealth: Payer: Self-pay | Admitting: *Deleted

## 2019-06-25 DIAGNOSIS — G4709 Other insomnia: Secondary | ICD-10-CM | POA: Diagnosis not present

## 2019-06-25 DIAGNOSIS — F419 Anxiety disorder, unspecified: Secondary | ICD-10-CM | POA: Diagnosis not present

## 2019-06-25 DIAGNOSIS — F329 Major depressive disorder, single episode, unspecified: Secondary | ICD-10-CM

## 2019-06-25 DIAGNOSIS — M545 Low back pain, unspecified: Secondary | ICD-10-CM

## 2019-06-25 DIAGNOSIS — G8929 Other chronic pain: Secondary | ICD-10-CM

## 2019-06-25 DIAGNOSIS — F32A Depression, unspecified: Secondary | ICD-10-CM

## 2019-06-25 MED ORDER — TRAZODONE HCL 100 MG PO TABS: ORAL_TABLET | ORAL | 1 refills | Status: DC

## 2019-06-25 MED ORDER — HYDROCODONE-ACETAMINOPHEN 5-325 MG PO TABS: ORAL_TABLET | ORAL | 0 refills | Status: DC

## 2019-06-25 MED ORDER — CITALOPRAM HYDROBROMIDE 40 MG PO TABS: ORAL_TABLET | ORAL | 1 refills | Status: DC

## 2019-06-25 NOTE — Progress Notes (Signed)
   Subjective:  Audio only  Patient ID: Karen Heath, female    DOB: 08-Oct-1960, 59 y.o.   MRN: [id number]  HPI This patient was seen today for chronic pain. Takes for back pain and for headaches  The medication list was reviewed and updated.   -Compliance with medication: sometimes does not take one and some days takes 2 per day instead of one.   - Number patient states they take daily: sometimes none, most days one tablet and sometimes has to take 2 per day  -when was the last dose patient took? 6 days ago  The patient was advised the importance of maintaining medication and not using illegal substances with these.  Here for refills and follow up  The patient was educated that we can provide 3 monthly scripts for their medication, it is their responsibility to follow the instructions.  Side effects or complications from medications: none  Patient is aware that pain medications are meant to minimize the severity of the pain to allow their pain levels to improve to allow for better function. They are aware of that pain medications cannot totally remove their pain.  Due for UDT ( at least once per year) : last one was done 09/24/15. Doing a virtual today so unable to get urine sample  Taking celexa for anxiety and depression. Gad 7 and phq9 done.        Review of Systems No chest pain no current headache no shortness of breath    Objective:   Physical Exam  Virtual      Assessment & Plan:  Impression chronic pain ongoing. Mainly takes for severe intermittent low back pain. Uses 30/month. Aware of potential side effects.  2. Depression clinically stable to maintain same meds. Also trazodone nightly helping with insomnia  Follow-up in 3 months medications refilled

## 2019-06-25 NOTE — Telephone Encounter (Signed)
Ms. Karen Heath, Karen Heath are scheduled for a virtual visit with your provider today.    Just as we do with appointments in the office, we must obtain your consent to participate.  Your consent will be active for this visit and any virtual visit you may have with one of our providers in the next 365 days.    If you have a MyChart account, I can also send a copy of this consent to you electronically.  All virtual visits are billed to your insurance company just like a traditional visit in the office.  As this is a virtual visit, video technology does not allow for your provider to perform a traditional examination.  This may limit your provider's ability to fully assess your condition.  If your provider identifies any concerns that need to be evaluated in person or the need to arrange testing such as labs, EKG, etc, we will make arrangements to do so.    Although advances in technology are sophisticated, we cannot ensure that it will always work on either your end or our end.  If the connection with a video visit is poor, we may have to switch to a telephone visit.  With either a video or telephone visit, we are not always able to ensure that we have a secure connection.   I need to obtain your verbal consent now.   Are you willing to proceed with your visit today?   Karen Heath has provided verbal consent on 06/25/2019 for a virtual visit (video or telephone).   Kyra Manges, LPN 7/32/2025  4:27 AM

## 2019-09-26 ENCOUNTER — Other Ambulatory Visit: Payer: Self-pay

## 2019-09-26 ENCOUNTER — Ambulatory Visit (INDEPENDENT_AMBULATORY_CARE_PROVIDER_SITE_OTHER): Payer: BLUE CROSS/BLUE SHIELD | Admitting: Nurse Practitioner

## 2019-09-26 ENCOUNTER — Encounter: Payer: Self-pay | Admitting: Nurse Practitioner

## 2019-09-26 VITALS — BP 138/88 | Temp 97.8°F | Wt 154.8 lb

## 2019-09-26 DIAGNOSIS — M545 Low back pain, unspecified: Secondary | ICD-10-CM

## 2019-09-26 DIAGNOSIS — F329 Major depressive disorder, single episode, unspecified: Secondary | ICD-10-CM

## 2019-09-26 DIAGNOSIS — Z79891 Long term (current) use of opiate analgesic: Secondary | ICD-10-CM | POA: Diagnosis not present

## 2019-09-26 DIAGNOSIS — G4709 Other insomnia: Secondary | ICD-10-CM | POA: Diagnosis not present

## 2019-09-26 DIAGNOSIS — G8929 Other chronic pain: Secondary | ICD-10-CM

## 2019-09-26 DIAGNOSIS — F419 Anxiety disorder, unspecified: Secondary | ICD-10-CM

## 2019-09-26 DIAGNOSIS — F32A Depression, unspecified: Secondary | ICD-10-CM

## 2019-09-26 MED ORDER — CITALOPRAM HYDROBROMIDE 40 MG PO TABS: ORAL_TABLET | ORAL | 1 refills | Status: DC

## 2019-09-26 MED ORDER — HYDROCODONE-ACETAMINOPHEN 5-325 MG PO TABS: ORAL_TABLET | ORAL | 0 refills | Status: DC

## 2019-09-26 NOTE — Progress Notes (Signed)
Subjective:    Patient ID: Karen Heath, female    DOB: 12-Jan-1961, 59 y.o.   MRN: [id number]  HPI This patient was seen today for chronic pain  The medication list was reviewed and updated.   -Compliance with medication: yes  - Number patient states they take daily: Patient states some days she takes none and some days she takes 2-3 as needed for pain.  -when was the last dose patient took? 1 week ago  The patient was advised the importance of maintaining medication and not using illegal substances with these.  Here for refills and follow up  The patient was educated that we can provide 3 monthly scripts for their medication, it is their responsibility to follow the instructions.  Side effects or complications from medications: none  Patient is aware that pain medications are meant to minimize the severity of the pain to allow their pain levels to improve to allow for better function. They are aware of that pain medications cannot totally remove their pain.  Due for UDT ( at least once per year) : done today  Scale of 1 to 10 ( 1 is least 10 is most) Your pain level without the medicine: 6 Your pain level with medication 3  Scale 1 to 10 ( 1-helps very little, 10 helps very well) How well does your pain medication reduce your pain so you can function better through out the day? 8  Only takes with severe pain; not every day. Has a very physical job. Continues to have mid low back pain. No sciatica.  Trazodone doing well for sleep.  Celexa working well for anxiety and depression.  Depression screen [facility] 2/9 09/26/2019 06/25/2019 12/06/2017  Decreased Interest 0 0 2  Down, Depressed, Hopeless 0 0 2  PHQ - 2 Score 0 0 4  Altered sleeping 0 0 3  Tired, decreased energy 1 3 2   Change in appetite 0 0 1  Feeling bad or failure about yourself  0 0 0  Trouble concentrating 0 0 0  Moving slowly or fidgety/restless 0 0 0  Suicidal thoughts 0 0 0  PHQ-9 Score 1 3 10     Difficult doing work/chores Not difficult at all Not difficult at all -       Review of Systems     Objective:   Physical Exam NAD. Alert, oriented. Lungs clear. Heart RRR. Mild tenderness to palpation mid lower spine. SLR neg bilaterally. Reflexes normal lower extremities. Gait normal.        Assessment & Plan:   Problem List Items Addressed This Visit      Other   Anxiety and depression   Relevant Medications   citalopram (CELEXA) 40 MG tablet   Back pain   Relevant Medications   HYDROcodone-acetaminophen (NORCO/VICODIN) 5-325 MG tablet   Encounter for long-term use of opiate analgesic - Primary   Relevant Orders   ToxASSURE Select 13 (MW), Urine   Insomnia     Meds ordered this encounter  Medications  . DISCONTD: HYDROcodone-acetaminophen (NORCO/VICODIN) 5-325 MG tablet    Sig: Take 1 tab po qd prn pain    Dispense:  30 tablet    Refill:  0    Order Specific Question:   Supervising Provider    Answer:   A [[id number]]  . citalopram (CELEXA) 40 MG tablet    Sig: take 1 tablet by mouth daily.    Dispense:  90 tablet    Refill:  1  Please consider 90 day supplies to promote better adherence    Order Specific Question:   Supervising Provider    Answer:   Lilyan Punt A [[id number]]  . DISCONTD: HYDROcodone-acetaminophen (NORCO/VICODIN) 5-325 MG tablet    Sig: Take 1 tab po qd prn pain    Dispense:  30 tablet    Refill:  0    May fill 30 days from 09/26/19    Order Specific Question:   Supervising Provider    Answer:   Lilyan Punt A [[id number]]  . HYDROcodone-acetaminophen (NORCO/VICODIN) 5-325 MG tablet    Sig: Take 1 tab po qd prn pain    Dispense:  30 tablet    Refill:  0    May fill 60 days from 09/26/19    Order Specific Question:   Supervising Provider    Answer:   Lilyan Punt A [[id number]]   Continue current medications as directed. Recommend preventive health physical this fall. Return in about 3 months (around 12/27/2019) for Pain management.

## 2019-09-27 ENCOUNTER — Encounter: Payer: Self-pay | Admitting: Nurse Practitioner

## 2019-10-01 LAB — TOXASSURE SELECT 13 (MW), URINE

## 2019-12-31 DIAGNOSIS — Z23 Encounter for immunization: Secondary | ICD-10-CM | POA: Diagnosis not present

## 2020-01-23 ENCOUNTER — Telehealth: Payer: Self-pay | Admitting: Family Medicine

## 2020-01-23 NOTE — Telephone Encounter (Signed)
[facility]  requesting refill on Trazodone 100 mg tablet. Take one tablet po once daily at bedtime. Pt last seen 09/26/19. Please advise. Thank you

## 2020-01-27 NOTE — Telephone Encounter (Signed)
Ok pls send 90 with 1 refill. Thx. D.r Tara Wich

## 2020-01-28 MED ORDER — TRAZODONE HCL 100 MG PO TABS
ORAL_TABLET | ORAL | 1 refills | Status: DC
Start: 2020-01-28 — End: 2020-07-13

## 2020-01-28 NOTE — Addendum Note (Signed)
Addended by: Marlowe Shores on: 01/28/2020 01:21 PM   Modules accepted: Orders

## 2020-01-28 NOTE — Telephone Encounter (Signed)
Refill sent to pharmacy.   

## 2020-06-14 ENCOUNTER — Telehealth: Payer: Self-pay | Admitting: Nurse Practitioner

## 2020-06-16 NOTE — Telephone Encounter (Signed)
Please contact patient to have her set up follow up office visit. If sending my chart message, please identify which office you are from and needing follow up office visit for meds. Thank you!

## 2020-06-24 NOTE — Telephone Encounter (Signed)
Made appointment 5/10 for medication

## 2020-07-13 ENCOUNTER — Other Ambulatory Visit: Payer: Self-pay

## 2020-07-13 ENCOUNTER — Ambulatory Visit (INDEPENDENT_AMBULATORY_CARE_PROVIDER_SITE_OTHER): Payer: BC Managed Care – PPO | Admitting: Family Medicine

## 2020-07-13 VITALS — BP 130/86 | HR 92 | Temp 97.4°F | Ht 62.0 in | Wt 148.2 lb

## 2020-07-13 DIAGNOSIS — R5383 Other fatigue: Secondary | ICD-10-CM

## 2020-07-13 DIAGNOSIS — F419 Anxiety disorder, unspecified: Secondary | ICD-10-CM

## 2020-07-13 DIAGNOSIS — K219 Gastro-esophageal reflux disease without esophagitis: Secondary | ICD-10-CM

## 2020-07-13 DIAGNOSIS — Z13220 Encounter for screening for lipoid disorders: Secondary | ICD-10-CM | POA: Diagnosis not present

## 2020-07-13 DIAGNOSIS — F32A Depression, unspecified: Secondary | ICD-10-CM

## 2020-07-13 MED ORDER — CITALOPRAM HYDROBROMIDE 40 MG PO TABS
ORAL_TABLET | ORAL | 1 refills | Status: DC
Start: 2020-07-13 — End: 2021-01-12

## 2020-07-13 MED ORDER — TRAZODONE HCL 100 MG PO TABS
ORAL_TABLET | ORAL | 1 refills | Status: DC
Start: 2020-07-13 — End: 2021-01-12

## 2020-07-13 NOTE — Progress Notes (Signed)
Patient ID: Karen Heath, female    DOB: 07-May-1960, 60 y.o.   MRN: [id number]   Chief Complaint  Patient presents with  . Depression    Follow up   Subjective:    HPI Pt seen for establish care. F/u with depression. No issues with medications taking celexa and trazodone. Energy feels low but otherwise feeling better.  H/o gerd taking otc.  Feeling that the medication is working [place].    Low energy, fatigue feeling- last few months. 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 Feeling not as much exercising with newer job.  Smoking 1 ppd- over 40 yrs.   Medical History Karen Heath has a past medical history of GERD (gastroesophageal reflux disease), Glaucoma, and Migraines.   Outpatient Encounter Medications as of 07/13/2020  Medication Sig  . citalopram (CELEXA) 40 MG tablet Take 1 tablet by mouth once daily  . HYDROcodone-acetaminophen (NORCO/VICODIN) 5-325 MG tablet Take 1 tab po qd prn pain (Patient not taking: Reported on 07/13/2020)  . traZODone (DESYREL) 100 MG tablet Take 1 tablet by mouth daily at bedtime  . [DISCONTINUED] citalopram (CELEXA) 40 MG tablet Take 1 tablet by mouth once daily  . [DISCONTINUED] traZODone (DESYREL) 100 MG tablet Take 1 tablet by mouth daily at bedtime   No facility-administered encounter medications on file as of 07/13/2020.     Review of Systems  Constitutional: Positive for fatigue. Negative for chills and fever.  HENT: Negative for congestion, rhinorrhea and sore throat.   Respiratory: Negative for cough, shortness of breath and wheezing.   Cardiovascular: Negative for chest pain and leg swelling.  Gastrointestinal: Negative for abdominal pain, diarrhea, nausea and vomiting.  Genitourinary: Negative for dysuria and frequency.  Musculoskeletal: Negative for arthralgias and back pain.  Skin: Negative for rash.  Neurological: Negative for dizziness, weakness and headaches.  Psychiatric/Behavioral: Negative for dysphoric  mood, self-injury, sleep disturbance and suicidal ideas. The patient is not nervous/anxious.      Vitals BP 130/86   Pulse 92   Temp (!) 97.4 F (36.3 C) (Oral)   Ht 5' 2"  (1.575 m)   Wt 148 lb 3.2 oz (67.2 kg)   SpO2 99%   BMI 27.11 kg/m   Objective:   Physical Exam Vitals and nursing note reviewed.  Constitutional:      Appearance: Normal appearance.  HENT:     Head: Normocephalic and atraumatic.     Nose: Nose normal.     Mouth/Throat:     Mouth: Mucous membranes are moist.     Pharynx: Oropharynx is clear.  Eyes:     Extraocular Movements: Extraocular movements intact.     Conjunctiva/sclera: Conjunctivae normal.     Pupils: Pupils are equal, round, and reactive to light.  Cardiovascular:     Rate and Rhythm: Normal rate and regular rhythm.     Pulses: Normal pulses.     Heart sounds: Murmur (3/6 systolic, best heard at LSB) heard.    Pulmonary:     Effort: Pulmonary effort is normal.     Breath sounds: Normal breath sounds. No wheezing, rhonchi or rales.  Musculoskeletal:        General: Normal range of motion.     Right lower leg: No edema.     Left lower leg: No edema.  Skin:    General: Skin is warm and dry.     Findings: No lesion or rash.  Neurological:     General: No focal deficit present.  Mental Status: She is alert and oriented to person, place, and time.  Psychiatric:        Mood and Affect: Mood normal.        Behavior: Behavior normal.      Assessment and Plan   1. Anxiety and depression  2. Gastroesophageal reflux disease without esophagitis - CBC - CMP14+EGFR  3. Lipid screening - Lipid panel  4. Fatigue, unspecified type - TSH   Anxiety and depression- taking celexa and trazodone. Doing well.  Will cont.  Fatigue- could be multifactorial.  Will check tsh. Increase in exercising.  gerd- stable.  Will check labs.  Cont otc meds prn.  Lipid screen- will order screening.    No follow-ups on file.

## 2020-07-25 ENCOUNTER — Encounter: Payer: Self-pay | Admitting: Family Medicine

## 2021-01-12 ENCOUNTER — Other Ambulatory Visit: Payer: Self-pay | Admitting: Family Medicine

## 2021-01-12 ENCOUNTER — Ambulatory Visit: Payer: BC Managed Care – PPO | Admitting: Nurse Practitioner

## 2021-01-21 ENCOUNTER — Other Ambulatory Visit: Payer: Self-pay

## 2021-01-21 ENCOUNTER — Ambulatory Visit: Payer: BC Managed Care – PPO | Admitting: Family Medicine

## 2021-01-21 DIAGNOSIS — J019 Acute sinusitis, unspecified: Secondary | ICD-10-CM

## 2021-01-21 DIAGNOSIS — J329 Chronic sinusitis, unspecified: Secondary | ICD-10-CM | POA: Insufficient documentation

## 2021-01-21 MED ORDER — ALBUTEROL SULFATE HFA 108 (90 BASE) MCG/ACT IN AERS: 2.0000 | INHALATION_SPRAY | Freq: Four times a day (QID) | RESPIRATORY_TRACT | 0 refills | Status: AC | PRN

## 2021-01-21 MED ORDER — DOXYCYCLINE HYCLATE 100 MG PO TABS: 100.0000 mg | ORAL_TABLET | Freq: Two times a day (BID) | ORAL | 0 refills | Status: DC

## 2021-01-21 MED ORDER — PREDNISONE 50 MG PO TABS: ORAL_TABLET | ORAL | 0 refills | Status: DC

## 2021-01-21 NOTE — Assessment & Plan Note (Signed)
Treating with doxycycline.  Also placing patient on prednisone.  Albuterol as needed.

## 2021-01-21 NOTE — Patient Instructions (Signed)
Medication as prescribed. ° °Call with concerns. ° °Take care ° °Dr Maida Widger °

## 2021-01-21 NOTE — Progress Notes (Signed)
Subjective:  Patient ID: Karen Heath, female    DOB: [birth year]  Age: 60 y.o. MRN: [id number]  CC: Chief Complaint  Patient presents with   Sinus Problem    Sinus symptoms for 2 weeks- feels like she has a sinus infection Covid test negative 3 days into illness    HPI:  60 year old female presents for evaluation of the above.  Patient reports that he has been sick for the past 2 weeks.  Started initially with cough.  Has now progressed and she has significant cough as well as upper respiratory congestion and chest congestion.  She is now experiencing postnasal drip as well.  Denies fever.  Denies body aches.  She states that she has used family members inhaler to help with her symptoms.  She states that she has improved but is still plagued by congestion and postnasal drip.  She has a history of sinusitis.  She believes that she is experiencing sinusitis.  No other associated symptoms.  No other complaints.  Patient Active Problem List   Diagnosis Date Noted   Sinusitis 01/21/2021   Back pain 12/06/2017   Encounter for long-term use of opiate analgesic 09/04/2014   Migraines 04/06/2014   Anxiety and depression 07/08/2012   Insomnia 07/08/2012   Morbid obesity (HCC) 07/08/2012   Glaucoma    GERD (gastroesophageal reflux disease)     Social Hx   Social History   Socioeconomic History   Marital status: Married    Spouse name: Not on file   Number of children: Not on file   Years of education: Not on file   Highest education level: Not on file  Occupational History   Not on file  Tobacco Use   Smoking status: Every Day    Packs/day: 1.00    Years: 34.00    Pack years: 34.00    Types: Cigarettes   Smokeless tobacco: Never  Substance and Sexual Activity   Alcohol use: No   Drug use: No   Sexual activity: Not Currently    Birth control/protection: Abstinence  Other Topics Concern   Not on file  Social History Narrative   Not on file   Social Determinants of  Health   Financial Resource Strain: Not on file  Food Insecurity: Not on file  Transportation Needs: Not on file  Physical Activity: Not on file  Stress: Not on file  Social Connections: Not on file    Review of Systems Per HPI  Objective:  BP 128/86   Pulse 88   Temp 98.1 F (36.7 C) (Oral)   Ht 5\' 2"  (1.575 m)   Wt 144 lb 12.8 oz (65.7 kg)   SpO2 98%   BMI 26.48 kg/m   BP/Weight 01/21/2021 07/13/2020 09/26/2019  Systolic BP 128 130 138  Diastolic BP 86 86 88  Wt. (Lbs) 144.8 148.2 154.8  BMI 26.48 27.11 28.31    Physical Exam Constitutional:      General: She is not in acute distress.    Appearance: Normal appearance. She is not ill-appearing.  HENT:     Head: Normocephalic and atraumatic.     Right Ear: Tympanic membrane normal.     Left Ear: Tympanic membrane normal.     Nose: Congestion present.     Mouth/Throat:     Pharynx: Oropharynx is clear.  Eyes:     General:        Right eye: No discharge.        Left eye:  No discharge.     Conjunctiva/sclera: Conjunctivae normal.  Cardiovascular:     Rate and Rhythm: Normal rate and regular rhythm.     Comments: 2/6 Systolic murmur noted. Pulmonary:     Effort: Pulmonary effort is normal.     Breath sounds: Normal breath sounds. No wheezing or rales.  Musculoskeletal:     Cervical back: Neck supple. No tenderness.  Neurological:     Mental Status: She is alert.  Psychiatric:        Mood and Affect: Mood normal.        Behavior: Behavior normal.    Assessment & Plan:   Problem List Items Addressed This Visit       Respiratory   Sinusitis    Treating with doxycycline.  Also placing patient on prednisone.  Albuterol as needed.      Relevant Medications   doxycycline (VIBRA-TABS) 100 MG tablet   predniSONE (DELTASONE) 50 MG tablet    Meds ordered this encounter  Medications   doxycycline (VIBRA-TABS) 100 MG tablet    Sig: Take 1 tablet (100 mg total) by mouth 2 (two) times daily.    Dispense:   20 tablet    Refill:  0   predniSONE (DELTASONE) 50 MG tablet    Sig: 1 tablet daily x 5 days    Dispense:  5 tablet    Refill:  0   albuterol (VENTOLIN HFA) 108 (90 Base) MCG/ACT inhaler    Sig: Inhale 2 puffs into the lungs every 6 (six) hours as needed for wheezing or shortness of breath.    Dispense:  18 g    Refill:  0    Follow-up:  PRN  Everlene Other DO [facility]

## 2021-04-08 ENCOUNTER — Other Ambulatory Visit: Payer: Self-pay | Admitting: Family Medicine

## 2021-07-18 ENCOUNTER — Other Ambulatory Visit: Payer: Self-pay | Admitting: Family Medicine

## 2021-08-18 ENCOUNTER — Other Ambulatory Visit: Payer: Self-pay | Admitting: Family Medicine

## 2021-09-09 ENCOUNTER — Other Ambulatory Visit: Payer: Self-pay | Admitting: Family Medicine

## 2021-10-06 ENCOUNTER — Encounter: Payer: Self-pay | Admitting: Nurse Practitioner

## 2021-10-06 ENCOUNTER — Ambulatory Visit: Payer: BC Managed Care – PPO | Admitting: Nurse Practitioner

## 2021-10-06 VITALS — BP 134/86 | HR 83 | Ht 62.0 in | Wt 140.0 lb

## 2021-10-06 DIAGNOSIS — M25532 Pain in left wrist: Secondary | ICD-10-CM | POA: Diagnosis not present

## 2021-10-06 NOTE — Progress Notes (Signed)
   Subjective:    Patient ID: Karen Heath, female    DOB: Jun 12, 1960, 61 y.o.   MRN: [id number]  HPI  Left wrist pain for about a month.  Patient states that she noticed pain approximately 1 month ago.  Patient states that she feels the pain in her wrist that sometimes will radiate up her arm.  Patient states that she uses Aleve at needed for pain relief which helps sometimes.  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  Patient denies any numbness, tingling, or swelling.  Patient denies any known injuries.  Review of Systems  Musculoskeletal:        Wrist pain  All other systems reviewed and are negative.      Objective:   Physical Exam Vitals reviewed.  Constitutional:      General: She is not in acute distress.    Appearance: Normal appearance. She is normal weight. She is not ill-appearing, toxic-appearing or diaphoretic.  HENT:     Head: Normocephalic and atraumatic.  Musculoskeletal:     Right wrist: Normal.     Left wrist: Tenderness present. No swelling, deformity, effusion, lacerations, bony tenderness, snuff box tenderness or crepitus. Normal range of motion. Normal pulse.     Comments: Grossly intact.  Left wrist tenderness to palpation dorsal aspect of wrist that radiates up forearm. No swelling or redness. No snuffbox tenderness.   Skin:    General: Skin is warm.  Neurological:     Mental Status: She is alert.     Comments: Grossly intact  Psychiatric:        Mood and Affect: Mood normal.        Behavior: Behavior normal.        Assessment & Plan:   1. Left wrist pain - Likely an over use injury - However, will examine with Xray to rule out fractures or other injures. - No recent kidney labs. Will get updated CMP to ensure safety in using NSAIDs. - Encouraged to buy OTC wrist brace to to use for short period of time.  - CMP14+EGFR - DG Wrist Complete Left - RTC as  needed if symptoms do not improve or worsen    Note:  This document was prepared using Dragon voice recognition software and may include unintentional dictation errors. Note - This record has been created using [employer].  Chart creation errors have been sought, but may not always  have been located. Such creation errors do not reflect on  the standard of medical care.

## 2021-10-10 ENCOUNTER — Ambulatory Visit (HOSPITAL_COMMUNITY)
Admission: RE | Admit: 2021-10-10 | Discharge: 2021-10-10 | Disposition: A | Discharge status: Home / Self Care | Payer: BC Managed Care – PPO | Source: Ambulatory Visit | Attending: Nurse Practitioner | Admitting: Nurse Practitioner

## 2021-10-10 DIAGNOSIS — M25532 Pain in left wrist: Secondary | ICD-10-CM | POA: Diagnosis not present

## 2021-10-11 LAB — CMP14+EGFR
ALT: 7 IU/L (ref 0–32)
AST: 12 IU/L (ref 0–40)
Albumin/Globulin Ratio: 1.8 (ref 1.2–2.2)
Albumin: 4.2 g/dL (ref 3.8–4.9)
Alkaline Phosphatase: 66 IU/L (ref 44–121)
BUN/Creatinine Ratio: 17 (ref 9–23)
BUN: 16 mg/dL (ref 6–24)
Bilirubin Total: <0.2 mg/dL (ref 0.0–1.2)
CO2: 22 mmol/L (ref 20–29)
Calcium: 9.6 mg/dL (ref 8.7–10.2)
Chloride: 105 mmol/L (ref 96–106)
Creatinine, Ser: 0.94 mg/dL (ref 0.57–1.00)
Globulin, Total: 2.3 g/dL (ref 1.5–4.5)
Glucose: 121 mg/dL — ABNORMAL HIGH (ref 70–99)
Potassium: 5.3 mmol/L — ABNORMAL HIGH (ref 3.5–5.2)
Sodium: 142 mmol/L (ref 134–144)
Total Protein: 6.5 g/dL (ref 6.0–8.5)
eGFR: 70 mL/min/{1.73_m2} (ref >59)

## 2021-10-12 ENCOUNTER — Other Ambulatory Visit: Payer: Self-pay | Admitting: Family Medicine

## 2021-11-08 ENCOUNTER — Other Ambulatory Visit: Payer: Self-pay | Admitting: Family Medicine

## 2021-11-13 ENCOUNTER — Other Ambulatory Visit: Payer: Self-pay | Admitting: Family Medicine

## 2021-12-09 ENCOUNTER — Ambulatory Visit: Payer: BC Managed Care – PPO | Admitting: Family Medicine

## 2021-12-16 ENCOUNTER — Ambulatory Visit (INDEPENDENT_AMBULATORY_CARE_PROVIDER_SITE_OTHER): Payer: BC Managed Care – PPO | Admitting: Family Medicine

## 2021-12-16 VITALS — BP 150/84 | HR 75 | Temp 97.7°F | Ht 62.0 in | Wt 130.0 lb

## 2021-12-16 DIAGNOSIS — M25532 Pain in left wrist: Secondary | ICD-10-CM

## 2021-12-16 DIAGNOSIS — R7309 Other abnormal glucose: Secondary | ICD-10-CM | POA: Diagnosis not present

## 2021-12-16 DIAGNOSIS — G4709 Other insomnia: Secondary | ICD-10-CM

## 2021-12-16 DIAGNOSIS — Z130 Encounter for screening for diseases of the blood and blood-forming organs and certain disorders involving the immune mechanism: Secondary | ICD-10-CM | POA: Diagnosis not present

## 2021-12-16 DIAGNOSIS — G8929 Other chronic pain: Secondary | ICD-10-CM

## 2021-12-16 DIAGNOSIS — Z13220 Encounter for screening for lipoid disorders: Secondary | ICD-10-CM | POA: Diagnosis not present

## 2021-12-16 MED ORDER — MELOXICAM 15 MG PO TABS: 15.0000 mg | ORAL_TABLET | Freq: Every day | ORAL | 0 refills | Status: AC

## 2021-12-16 MED ORDER — TRAZODONE HCL 100 MG PO TABS
100.0000 mg | ORAL_TABLET | Freq: Every evening | ORAL | 1 refills | Status: DC | PRN
Start: 2021-12-16 — End: 2022-06-12

## 2021-12-16 NOTE — Patient Instructions (Signed)
Medication as prescribed.  If wrist pain continues please let me know and I will refer you to Ortho.  Labs today.  Call [phone] to arrange mammogram.  Take care  Dr. Lacinda Axon

## 2021-12-17 LAB — CBC
Hematocrit: 39.3 % (ref 34.0–46.6)
Hemoglobin: 13.1 g/dL (ref 11.1–15.9)
MCH: 31.7 pg (ref 26.6–33.0)
MCHC: 33.3 g/dL (ref 31.5–35.7)
MCV: 95 fL (ref 79–97)
Platelets: 284 10*3/uL (ref 150–450)
RBC: 4.13 x10E6/uL (ref 3.77–5.28)
RDW: 12.6 % (ref 11.7–15.4)
WBC: 8.4 10*3/uL (ref 3.4–10.8)

## 2021-12-17 LAB — CMP14+EGFR
ALT: 6 IU/L (ref 0–32)
AST: 16 IU/L (ref 0–40)
Albumin/Globulin Ratio: 2.2 (ref 1.2–2.2)
Albumin: 4.8 g/dL (ref 3.8–4.9)
Alkaline Phosphatase: 68 IU/L (ref 44–121)
BUN/Creatinine Ratio: 16 (ref 12–28)
BUN: 17 mg/dL (ref 8–27)
Bilirubin Total: <0.2 mg/dL (ref 0.0–1.2)
CO2: 25 mmol/L (ref 20–29)
Calcium: 9.8 mg/dL (ref 8.7–10.3)
Chloride: 101 mmol/L (ref 96–106)
Creatinine, Ser: 1.05 mg/dL — ABNORMAL HIGH (ref 0.57–1.00)
Globulin, Total: 2.2 g/dL (ref 1.5–4.5)
Glucose: 87 mg/dL (ref 70–99)
Potassium: 5.2 mmol/L (ref 3.5–5.2)
Sodium: 138 mmol/L (ref 134–144)
Total Protein: 7 g/dL (ref 6.0–8.5)
eGFR: 61 mL/min/{1.73_m2} (ref >59)

## 2021-12-17 LAB — LIPID PANEL
Chol/HDL Ratio: 4.7 ratio — ABNORMAL HIGH (ref 0.0–4.4)
Cholesterol, Total: 189 mg/dL (ref 100–199)
HDL: 40 mg/dL (ref >39)
LDL Chol Calc (NIH): 135 mg/dL — ABNORMAL HIGH (ref 0–99)
Triglycerides: 77 mg/dL (ref 0–149)
VLDL Cholesterol Cal: 14 mg/dL (ref 5–40)

## 2021-12-17 LAB — HEMOGLOBIN A1C
Est. average glucose Bld gHb Est-mCnc: 105 mg/dL
Hgb A1c MFr Bld: 5.3 % (ref 4.8–5.6)

## 2021-12-19 DIAGNOSIS — G8929 Other chronic pain: Secondary | ICD-10-CM | POA: Insufficient documentation

## 2021-12-19 NOTE — Assessment & Plan Note (Signed)
Unclear etiology.  Exam unrevealing.  Has had recent x-ray imaging which was independently reviewed by me.  Interpretation: Normal x-ray.  Trial of meloxicam.  If fails to improve or worsens, will refer to orthopedics.

## 2021-12-19 NOTE — Progress Notes (Signed)
Subjective:  Patient ID: Karen Heath, female    DOB: 05-18-1960  Age: 61 y.o. MRN: [id number]  CC: Chief Complaint  Patient presents with   Anxiety    depression   left wrist pain    Had x ray , hurts all the time, wears gove, iceing, aleeve - ask about cortisone shot - repetitive motion    HPI:  61 year old female presents for evaluation the above.  Patient suffers from insomnia.  Trazodone works well for her.  Needs refill.  Patient reports continued left wrist pain.  Has been going on for the past 1.5 months.  Has had recent imaging which was unremarkable.  Wrist pain is located diffusely.  No recent fall, trauma, injury.  No relieving factors.  No other complaints.  Patient Active Problem List   Diagnosis Date Noted   Chronic wrist pain, left 12/19/2021   Migraines 04/06/2014   Anxiety and depression 07/08/2012   Insomnia 07/08/2012   Glaucoma    GERD (gastroesophageal reflux disease)     Social Hx   Social History   Socioeconomic History   Marital status: Married    Spouse name: Not on file   Number of children: Not on file   Years of education: Not on file   Highest education level: Not on file  Occupational History   Not on file  Tobacco Use   Smoking status: Every Day    Packs/day: 1.00    Years: 34.00    Total pack years: 34.00    Types: Cigarettes   Smokeless tobacco: Never  Substance and Sexual Activity   Alcohol use: No   Drug use: No   Sexual activity: Not Currently    Birth control/protection: Abstinence  Other Topics Concern   Not on file  Social History Narrative   Not on file   Social Determinants of Health   Financial Resource Strain: Not on file  Food Insecurity: Not on file  Transportation Needs: Not on file  Physical Activity: Not on file  Stress: Not on file  Social Connections: Not on file    Review of Systems Per HPI  Objective:  BP (!) 150/84   Pulse 75   Temp 97.7 F (36.5 C)   Ht 5' 2"  (1.575 m)   Wt 130  lb (59 kg)   SpO2 99%   BMI 23.78 kg/m      12/16/2021    3:16 PM 12/16/2021    2:52 PM 10/06/2021    3:32 PM  BP/Weight  Systolic BP 409 811 914  Diastolic BP 84 92 86  Wt. (Lbs)  130 140  BMI  23.78 kg/m2 25.61 kg/m2    Physical Exam Vitals and nursing note reviewed.  Constitutional:      General: She is not in acute distress.    Appearance: Normal appearance.  HENT:     Head: Normocephalic and atraumatic.  Cardiovascular:     Rate and Rhythm: Normal rate and regular rhythm.     Heart sounds: No murmur heard. Pulmonary:     Effort: Pulmonary effort is normal.     Breath sounds: Normal breath sounds. No wheezing, rhonchi or rales.  Musculoskeletal:     Comments: Left wrist -no appreciable swelling.  Diffusely tender to palpation.  Neurological:     Mental Status: She is alert.  Psychiatric:        Mood and Affect: Mood normal.        Behavior: Behavior normal.  Lab Results  Component Value Date   WBC 8.4 12/16/2021   HGB 13.1 12/16/2021   HCT 39.3 12/16/2021   PLT 284 12/16/2021   GLUCOSE 87 12/16/2021   CHOL 189 12/16/2021   TRIG 77 12/16/2021   HDL 40 12/16/2021   LDLCALC 135 (H) 12/16/2021   ALT 6 12/16/2021   AST 16 12/16/2021   NA 138 12/16/2021   K 5.2 12/16/2021   CL 101 12/16/2021   CREATININE 1.05 (H) 12/16/2021   BUN 17 12/16/2021   CO2 25 12/16/2021   HGBA1C 5.3 12/16/2021     Assessment & Plan:   Problem List Items Addressed This Visit       Other   Chronic wrist pain, left - Primary    Unclear etiology.  Exam unrevealing.  Has had recent x-ray imaging which was independently reviewed by me.  Interpretation: Normal x-ray.  Trial of meloxicam.  If fails to improve or worsens, will refer to orthopedics.      Relevant Medications   traZODone (DESYREL) 100 MG tablet   meloxicam (MOBIC) 15 MG tablet   Insomnia    Stable on trazodone.  Continue.  Refilled today.      Other Visit Diagnoses     Screening for deficiency anemia        Relevant Orders   CBC (Completed)   Elevated glucose       Relevant Orders   CMP14+EGFR (Completed)   Hemoglobin A1c (Completed)   Screening, lipid       Relevant Orders   Lipid panel (Completed)       Meds ordered this encounter  Medications   traZODone (DESYREL) 100 MG tablet    Sig: Take 1 tablet (100 mg total) by mouth at bedtime as needed for sleep.    Dispense:  90 tablet    Refill:  1   meloxicam (MOBIC) 15 MG tablet    Sig: Take 1 tablet (15 mg total) by mouth daily.    Dispense:  30 tablet    Refill:  0    Follow-up:  Return in about 3 months (around 03/18/2022).  [place]

## 2021-12-19 NOTE — Assessment & Plan Note (Signed)
Stable on trazodone.  Continue.  Refilled today.

## 2021-12-21 ENCOUNTER — Other Ambulatory Visit: Payer: Self-pay | Admitting: Family Medicine

## 2021-12-21 MED ORDER — ATORVASTATIN CALCIUM 40 MG PO TABS: 40.0000 mg | ORAL_TABLET | Freq: Every day | ORAL | 3 refills | Status: AC

## 2022-02-12 ENCOUNTER — Other Ambulatory Visit: Payer: Self-pay | Admitting: Family Medicine

## 2022-03-20 ENCOUNTER — Ambulatory Visit: Payer: BC Managed Care – PPO | Admitting: Family Medicine

## 2022-05-03 ENCOUNTER — Ambulatory Visit
Admission: RE | Admit: 2022-05-03 | Discharge: 2022-05-03 | Disposition: A | Discharge status: Home / Self Care | Payer: 59 | Source: Ambulatory Visit | Attending: Emergency Medicine | Admitting: Emergency Medicine

## 2022-05-03 ENCOUNTER — Ambulatory Visit (INDEPENDENT_AMBULATORY_CARE_PROVIDER_SITE_OTHER): Payer: 59

## 2022-05-03 VITALS — BP 150/88 | HR 89 | Temp 98.2°F | Resp 20

## 2022-05-03 DIAGNOSIS — Z23 Encounter for immunization: Secondary | ICD-10-CM

## 2022-05-03 DIAGNOSIS — S61431A Puncture wound without foreign body of right hand, initial encounter: Secondary | ICD-10-CM

## 2022-05-03 DIAGNOSIS — Z0389 Encounter for observation for other suspected diseases and conditions ruled out: Secondary | ICD-10-CM | POA: Diagnosis not present

## 2022-05-03 DIAGNOSIS — S60551A Superficial foreign body of right hand, initial encounter: Secondary | ICD-10-CM | POA: Diagnosis not present

## 2022-05-03 DIAGNOSIS — L089 Local infection of the skin and subcutaneous tissue, unspecified: Secondary | ICD-10-CM

## 2022-05-03 MED ORDER — CEPHALEXIN 500 MG PO CAPS: 1000.0000 mg | ORAL_CAPSULE | Freq: Two times a day (BID) | ORAL | 0 refills | Status: AC

## 2022-05-03 MED ORDER — TETANUS-DIPHTH-ACELL PERTUSSIS 5-2.5-18.5 LF-MCG/0.5 IM SUSY
0.5000 mL | PREFILLED_SYRINGE | Freq: Once | INTRAMUSCULAR | Status: AC
  Administered 2022-05-03: 0.5 mL via INTRAMUSCULAR

## 2022-05-03 NOTE — ED Provider Notes (Signed)
HPI  SUBJECTIVE:  Karen Heath is a right-handed 62 y.o. female who presents with constant, throbbing pain, swelling between her thumb and right index finger after getting a wooden splinter in it 1 week ago.  She removed it with a sharp object and tweezers, and was able to get some purulent drainage out the day after removing it, has been putting Neosporin on it.  She reports swelling, erythema along the webspace at the end of the day, but is better by the next morning.  No limitation of motion of the thumb.  She is unsure if there is any retained foreign body.  No fevers.  No alleviating factors.  Pain and swelling are worse at the end of the work day.  She has a past medical history of migraines, glaucoma.  No history of MRSA.  Her last tetanus is unknown.  PCP: [facility].    Past Medical History:  Diagnosis Date   GERD (gastroesophageal reflux disease)    Glaucoma    mild   Migraines     Past Surgical History:  Procedure Laterality Date   OOPHORECTOMY Left    cyst    Family History  Problem Relation Age of Onset   Hypertension Mother    Hypertension Father    Diabetes Other    Diabetes Other     Social History   Tobacco Use   Smoking status: Every Day    Packs/day: 1.00    Years: 34.00    Total pack years: 34.00    Types: Cigarettes   Smokeless tobacco: Never  Substance Use Topics   Alcohol use: No   Drug use: No    No current facility-administered medications for this encounter.  Current Outpatient Medications:    cephALEXin (KEFLEX) 500 MG capsule, Take 2 capsules (1,000 mg total) by mouth 2 (two) times daily for 7 days., Disp: 28 capsule, Rfl: 0   albuterol (VENTOLIN HFA) 108 (90 Base) MCG/ACT inhaler, Inhale 2 puffs into the lungs every 6 (six) hours as needed for wheezing or shortness of breath., Disp: 18 g, Rfl: 0   atorvastatin (LIPITOR) 40 MG tablet, Take 1 tablet (40 mg total) by mouth daily., Disp: 90 tablet, Rfl: 3   citalopram  (CELEXA) 40 MG tablet, Take 1 tablet by mouth once daily, Disp: 90 tablet, Rfl: 0   meloxicam (MOBIC) 15 MG tablet, Take 1 tablet (15 mg total) by mouth daily., Disp: 30 tablet, Rfl: 0   traZODone (DESYREL) 100 MG tablet, Take 1 tablet (100 mg total) by mouth at bedtime as needed for sleep., Disp: 90 tablet, Rfl: 1  Allergies  Allergen Reactions   Augmentin [Amoxicillin-Pot Clavulanate]     Diarrhea, yeast infection     ROS  As noted in HPI.   Physical Exam  BP (!) 150/88 (BP Location: Left Arm)   Pulse 89   Temp 98.2 F (36.8 C) (Oral)   Resp 20   SpO2 96%   Constitutional: Well developed, well nourished, no acute distress Eyes:  EOMI, conjunctiva normal bilaterally HENT: Normocephalic, atraumatic,mucus membranes moist Respiratory: Normal inspiratory effort Cardiovascular: Normal rate GI: nondistended skin: No erythema Musculoskeletal: Right hand: Hard, tender mass with puncture wound in the webbing between the thumb and index finger.  No expressible purulent drainage.  No surrounding induration.  No tenderness over the flexor tendon, thenar eminence.  Swelling over thenar eminence and in between webspace.  No limitation of motion at the [facility], MCP,  IP joint.  2 point  discrimination distally intact.       Neurologic: Alert & oriented x 3, no focal neuro deficits Psychiatric: Speech and behavior appropriate   ED Course   Medications  Tdap (BOOSTRIX) injection 0.5 mL (0.5 mLs Intramuscular Given 05/03/22 0951)    Orders Placed This Encounter  Procedures   DG Hand Complete Right    Standing Status:   Standing    Number of Occurrences:   1    Order Specific Question:   Reason for Exam (SYMPTOM  OR DIAGNOSIS REQUIRED)    Answer:   Splinter in web between thumb and index finger 1 week ago.  Rule out foreign body, subcutaneous air, osteomyelitis at [facility] joint.    No results found for this or any previous visit (from the past 24 hour(s)). DG Hand Complete  Right  Result Date: 05/03/2022 CLINICAL DATA:  Possible foreign body. EXAM: RIGHT HAND - COMPLETE 3+ VIEW COMPARISON:  None Available. FINDINGS: There is no evidence of fracture or dislocation. There is no evidence of arthropathy or other focal bone abnormality. Soft tissues are unremarkable. No radiopaque foreign body is noted. IMPRESSION: Negative. Electronically Signed   By: Marijo Conception M.D.   On: 05/03/2022 09:54    ED Clinical Impression  1. Puncture wound of right hand with infection, initial encounter      ED Assessment/Plan     Updating tetanus.  Obtaining x-ray to rule out any subcutaneous air, osteomyelitis or retained foreign body although discussed with patient that we may not see wood on x-ray.  Reviewed imaging independently.  No subcutaneous air.  Joints normal.  No radiopaque foreign body.  See radiology report for full details.  X-rays negative for osteomyelitis or subcutaneous air. No evidence of flexor tenosynovitis.  I do not appreciate any superficial foreign body on exam.  Will send home with Keflex 1000 mg twice daily for 1 week, warm compresses.   Follow-up with hand surgeon at [facility] if getting worse or not improving after finishing antibiotics.  Discussed imaging, MDM, treatment plan, and plan for follow-up with patient. patient agrees with plan.   Meds ordered this encounter  Medications   Tdap (BOOSTRIX) injection 0.5 mL   cephALEXin (KEFLEX) 500 MG capsule    Sig: Take 2 capsules (1,000 mg total) by mouth 2 (two) times daily for 7 days.    Dispense:  28 capsule    Refill:  0      *This clinic note was created using [employer]. Therefore, there may be occasional mistakes despite careful proofreading.  ?    Melynda Ripple, MD 05/03/22 1037

## 2022-05-03 NOTE — ED Triage Notes (Signed)
Pt reports she has a splinter in her the base of her right thumb x 1 week. She used a sharp object and unclean tweezers to remove object. Right base of the thumb is slightly red and swollen.

## 2022-05-03 NOTE — Discharge Instructions (Signed)
Your x-ray was normal.  There is no subcutaneous air, foreign body noted.  The joints appear normal.  Finish the Keflex, even if you feel better.  Warm compresses in the area.  Please follow-up with hand surgery at [facility] if not better after finishing the antibiotics.

## 2022-05-08 ENCOUNTER — Other Ambulatory Visit: Payer: Self-pay | Admitting: Family Medicine

## 2022-05-08 DIAGNOSIS — Z1231 Encounter for screening mammogram for malignant neoplasm of breast: Secondary | ICD-10-CM

## 2022-05-14 ENCOUNTER — Other Ambulatory Visit: Payer: Self-pay | Admitting: Family Medicine

## 2022-06-10 ENCOUNTER — Other Ambulatory Visit: Payer: Self-pay | Admitting: Family Medicine

## 2022-08-13 ENCOUNTER — Other Ambulatory Visit: Payer: Self-pay | Admitting: Family Medicine

## 2022-09-10 ENCOUNTER — Other Ambulatory Visit: Payer: Self-pay | Admitting: Family Medicine

## 2022-10-07 ENCOUNTER — Other Ambulatory Visit: Payer: Self-pay

## 2022-10-07 ENCOUNTER — Emergency Department (HOSPITAL_COMMUNITY)
Admission: EM | Admit: 2022-10-07 | Discharge: 2022-10-07 | Disposition: A | Discharge status: Home / Self Care | Payer: 59 | Attending: Emergency Medicine | Admitting: Emergency Medicine

## 2022-10-07 ENCOUNTER — Emergency Department (HOSPITAL_COMMUNITY): Payer: 59

## 2022-10-07 ENCOUNTER — Encounter (HOSPITAL_COMMUNITY): Payer: Self-pay | Admitting: Pharmacy Technician

## 2022-10-07 DIAGNOSIS — E876 Hypokalemia: Secondary | ICD-10-CM | POA: Diagnosis not present

## 2022-10-07 DIAGNOSIS — R935 Abnormal findings on diagnostic imaging of other abdominal regions, including retroperitoneum: Secondary | ICD-10-CM | POA: Diagnosis not present

## 2022-10-07 DIAGNOSIS — K838 Other specified diseases of biliary tract: Secondary | ICD-10-CM | POA: Diagnosis not present

## 2022-10-07 DIAGNOSIS — K529 Noninfective gastroenteritis and colitis, unspecified: Secondary | ICD-10-CM | POA: Diagnosis not present

## 2022-10-07 DIAGNOSIS — I7143 Infrarenal abdominal aortic aneurysm, without rupture: Secondary | ICD-10-CM | POA: Diagnosis not present

## 2022-10-07 DIAGNOSIS — R197 Diarrhea, unspecified: Secondary | ICD-10-CM | POA: Diagnosis present

## 2022-10-07 DIAGNOSIS — K5100 Ulcerative (chronic) pancolitis without complications: Secondary | ICD-10-CM | POA: Diagnosis not present

## 2022-10-07 DIAGNOSIS — Z20822 Contact with and (suspected) exposure to covid-19: Secondary | ICD-10-CM | POA: Diagnosis not present

## 2022-10-07 LAB — COMPREHENSIVE METABOLIC PANEL
ALT: 11 U/L (ref 0–44)
AST: 12 U/L — ABNORMAL LOW (ref 15–41)
Albumin: 3 g/dL — ABNORMAL LOW (ref 3.5–5.0)
Alkaline Phosphatase: 66 U/L (ref 38–126)
Anion gap: 11 (ref 5–15)
BUN: 12 mg/dL (ref 6–20)
CO2: 23 mmol/L (ref 22–32)
Calcium: 8.1 mg/dL — ABNORMAL LOW (ref 8.9–10.3)
Chloride: 98 mmol/L (ref 98–111)
Creatinine, Ser: 0.78 mg/dL (ref 0.44–1.00)
GFR, Estimated: >60 mL/min (ref >60)
Glucose, Bld: 106 mg/dL — ABNORMAL HIGH (ref 70–99)
Potassium: 2.4 mmol/L — CL (ref 3.5–5.1)
Sodium: 132 mmol/L — ABNORMAL LOW (ref 135–145)
Total Bilirubin: 0.6 mg/dL (ref 0.3–1.2)
Total Protein: 6.3 g/dL — ABNORMAL LOW (ref 6.5–8.1)

## 2022-10-07 LAB — CBC WITH DIFFERENTIAL/PLATELET
Abs Immature Granulocytes: 0.04 10*3/uL (ref 0.00–0.07)
Basophils Absolute: 0 10*3/uL (ref 0.0–0.1)
Basophils Relative: 0 %
Eosinophils Absolute: 0.1 10*3/uL (ref 0.0–0.5)
Eosinophils Relative: 1 %
HCT: 36.7 % (ref 36.0–46.0)
Hemoglobin: 12.8 g/dL (ref 12.0–15.0)
Immature Granulocytes: 0 %
Lymphocytes Relative: 22 %
Lymphs Abs: 2.1 10*3/uL (ref 0.7–4.0)
MCH: 31.1 pg (ref 26.0–34.0)
MCHC: 34.9 g/dL (ref 30.0–36.0)
MCV: 89.3 fL (ref 80.0–100.0)
Monocytes Absolute: 1.4 10*3/uL — ABNORMAL HIGH (ref 0.1–1.0)
Monocytes Relative: 14 %
Neutro Abs: 6 10*3/uL (ref 1.7–7.7)
Neutrophils Relative %: 63 %
Platelets: 301 10*3/uL (ref 150–400)
RBC: 4.11 MIL/uL (ref 3.87–5.11)
RDW: 13.6 % (ref 11.5–15.5)
WBC: 9.6 10*3/uL (ref 4.0–10.5)
nRBC: 0 % (ref 0.0–0.2)

## 2022-10-07 LAB — URINALYSIS, ROUTINE W REFLEX MICROSCOPIC
Bacteria, UA: NONE SEEN
Bilirubin Urine: NEGATIVE
Glucose, UA: NEGATIVE mg/dL
Ketones, ur: NEGATIVE mg/dL
Nitrite: NEGATIVE
Protein, ur: NEGATIVE mg/dL
Specific Gravity, Urine: >1.046 — ABNORMAL HIGH (ref 1.005–1.030)
pH: 5 (ref 5.0–8.0)

## 2022-10-07 LAB — LIPASE, BLOOD: Lipase: 22 U/L (ref 11–51)

## 2022-10-07 LAB — MAGNESIUM: Magnesium: 1.9 mg/dL (ref 1.7–2.4)

## 2022-10-07 LAB — SARS CORONAVIRUS 2 BY RT PCR: SARS Coronavirus 2 by RT PCR: NEGATIVE

## 2022-10-07 MED ORDER — POTASSIUM CHLORIDE CRYS ER 20 MEQ PO TBCR: 20.0000 meq | EXTENDED_RELEASE_TABLET | Freq: Two times a day (BID) | ORAL | 0 refills | Status: AC

## 2022-10-07 MED ORDER — POTASSIUM CHLORIDE CRYS ER 20 MEQ PO TBCR
40.0000 meq | EXTENDED_RELEASE_TABLET | Freq: Once | ORAL | Status: AC
  Administered 2022-10-07: 40 meq via ORAL
  Filled 2022-10-07: qty 2

## 2022-10-07 MED ORDER — CIPROFLOXACIN HCL 500 MG PO TABS: 500.0000 mg | ORAL_TABLET | Freq: Two times a day (BID) | ORAL | 0 refills | Status: AC

## 2022-10-07 MED ORDER — ONDANSETRON HCL 4 MG/2ML IJ SOLN
4.0000 mg | Freq: Once | INTRAMUSCULAR | Status: AC
  Administered 2022-10-07: 4 mg via INTRAVENOUS
  Filled 2022-10-07: qty 2

## 2022-10-07 MED ORDER — IOHEXOL 300 MG/ML  SOLN
100.0000 mL | Freq: Once | INTRAMUSCULAR | Status: AC | PRN
  Administered 2022-10-07: 100 mL via INTRAVENOUS

## 2022-10-07 MED ORDER — SODIUM CHLORIDE 0.9 % IV BOLUS
1000.0000 mL | Freq: Once | INTRAVENOUS | Status: AC
  Administered 2022-10-07: 1000 mL via INTRAVENOUS

## 2022-10-07 MED ORDER — POTASSIUM CHLORIDE 10 MEQ/100ML IV SOLN
10.0000 meq | INTRAVENOUS | Status: AC
  Administered 2022-10-07 (×3): 10 meq via INTRAVENOUS
  Filled 2022-10-07: qty 100

## 2022-10-07 NOTE — ED Triage Notes (Signed)
Pt here with complaints of diarrhea for the last week along with nausea and decreased appetite. Pt denies fevers, denies sick contacts. Has taken immodium with some relief.

## 2022-10-07 NOTE — Discharge Instructions (Signed)
As discussed, your potassium today was very low.  You have been given potassium here as well as a prescription for potassium to take for the next 5 days.  These have your primary care provider recheck your potassium in 1 week.  Your CT today shows that you have colitis.  Please take the antibiotic as directed until finished.  Return to the emergency department for any new or worsening symptoms.

## 2022-10-07 NOTE — ED Notes (Signed)
Patient Alert and oriented to baseline. Stable and ambulatory to baseline. Patient verbalized understanding of the discharge instructions.  Patient belongings were taken by the patient.   

## 2022-10-07 NOTE — ED Provider Notes (Signed)
[place] EMERGENCY DEPARTMENT AT [facility] Provider Note   CSN: [id number] Arrival date & time: 10/07/22  0825     History {Add pertinent medical, surgical, social history, OB history to HPI:1} Chief Complaint  Patient presents with   Diarrhea    Karen Heath is a 62 y.o. female.   Diarrhea Associated symptoms: abdominal pain   Associated symptoms: no arthralgias, no chills, no fever, no headaches and no vomiting        Karen Heath is a 62 y.o. female with past medical history of GERD and migraines who presents to the Emergency Department complaining of crampy lower abdominal pain with diarrhea x 1 week.  States the diarrhea has been excessive at times but somewhat slowed down at present.  Diarrhea has been associated with nausea but no vomiting.  No fever or chills cough or shortness of breath.  Has taken Imodium with minimal relief.  Reports decreased appetite and feels as though she may be dehydrated.  Having some generalized weakness as well.  Denies any bloody black or tarry appearing stools.  No dysuria.  Denies any recent antibiotic use.  Prior surgical history includes oophorectomy.   Home Medications Prior to Admission medications   Medication Sig Start Date End Date Taking? Authorizing Provider  albuterol (VENTOLIN HFA) 108 (90 Base) MCG/ACT inhaler Inhale 2 puffs into the lungs every 6 (six) hours as needed for wheezing or shortness of breath. 01/21/21   Tommie Sams, DO  atorvastatin (LIPITOR) 40 MG tablet Take 1 tablet (40 mg total) by mouth daily. 12/21/21   Tommie Sams, DO  citalopram (CELEXA) 40 MG tablet Take 1 tablet by mouth once daily 08/15/22   Tommie Sams, DO  meloxicam (MOBIC) 15 MG tablet Take 1 tablet (15 mg total) by mouth daily. 12/16/21   Tommie Sams, DO  traZODone (DESYREL) 100 MG tablet TAKE 1 TABLET BY MOUTH AT BEDTIME AS NEEDED FOR SLEEP 09/11/22   Tommie Sams, DO      Allergies    Augmentin [amoxicillin-pot  clavulanate]    Review of Systems   Review of Systems  Constitutional:  Positive for appetite change. Negative for chills and fever.  HENT:  Negative for congestion and trouble swallowing.   Respiratory:  Negative for cough and shortness of breath.   Cardiovascular:  Negative for chest pain and palpitations.  Gastrointestinal:  Positive for abdominal pain, anal bleeding, diarrhea and nausea. Negative for blood in stool and vomiting.  Genitourinary:  Negative for dysuria and flank pain.  Musculoskeletal:  Negative for arthralgias.  Skin:  Negative for rash.  Neurological:  Positive for weakness. Negative for dizziness, syncope, numbness and headaches.    Physical Exam Updated Vital Signs BP 110/74   Pulse 81   Temp 97.9 F (36.6 C)   Resp 18   SpO2 96%  Physical Exam Vitals and nursing note reviewed.  Constitutional:      General: She is not in acute distress.    Appearance: Normal appearance. She is not ill-appearing or toxic-appearing.  HENT:     Mouth/Throat:     Mouth: Mucous membranes are dry.     Comments: Mucous membranes slightly dry Eyes:     Extraocular Movements: Extraocular movements intact.     Conjunctiva/sclera: Conjunctivae normal.     Pupils: Pupils are equal, round, and reactive to light.  Cardiovascular:     Rate and Rhythm: Normal rate and regular rhythm.     Pulses:  Normal pulses.  Pulmonary:     Effort: Pulmonary effort is normal.  Abdominal:     Palpations: Abdomen is soft.     Tenderness: There is abdominal tenderness.     Comments: Mild suprapubic to left lower quadrant tenderness on abdominal exam.  No guarding or rebound.  No palpable masses.  Musculoskeletal:        General: Normal range of motion.     Cervical back: Normal range of motion.     Right lower leg: No edema.     Left lower leg: No edema.  Lymphadenopathy:     Cervical: No cervical adenopathy.  Skin:    General: Skin is warm.     Capillary Refill: Capillary refill takes less  than 2 seconds.  Neurological:     General: No focal deficit present.     Mental Status: She is alert.     Sensory: No sensory deficit.     Motor: No weakness.     ED Results / Procedures / Treatments   Labs (all labs ordered are listed, but only abnormal results are displayed) Labs Reviewed  CBC WITH DIFFERENTIAL/PLATELET - Abnormal; Notable for the following components:      Result Value   Monocytes Absolute 1.4 (*)    All other components within normal limits  COMPREHENSIVE METABOLIC PANEL - Abnormal; Notable for the following components:   Sodium 132 (*)    Potassium 2.4 (*)    Glucose, Bld 106 (*)    Calcium 8.1 (*)    Total Protein 6.3 (*)    Albumin 3.0 (*)    AST 12 (*)    All other components within normal limits  URINALYSIS, ROUTINE W REFLEX MICROSCOPIC - Abnormal; Notable for the following components:   Specific Gravity, Urine >1.046 (*)    Hgb urine dipstick LARGE (*)    Leukocytes,Ua MODERATE (*)    All other components within normal limits  SARS CORONAVIRUS 2 BY RT PCR  C DIFFICILE QUICK SCREEN W PCR REFLEX    LIPASE, BLOOD  MAGNESIUM    EKG None  Radiology CT ABDOMEN PELVIS W CONTRAST  Result Date: 10/07/2022 CLINICAL DATA:  Left lower quadrant abdominal pain and diarrhea. EXAM: CT ABDOMEN AND PELVIS WITH CONTRAST TECHNIQUE: Multidetector CT imaging of the abdomen and pelvis was performed using the standard protocol following bolus administration of intravenous contrast. RADIATION DOSE REDUCTION: This exam was performed according to the departmental dose-optimization program which includes automated exposure control, adjustment of the mA and/or kV according to patient size and/or use of iterative reconstruction technique. CONTRAST:  OMNIPAQUE IOHEXOL 300 MG/ML  SOLN COMPARISON:  None Available. FINDINGS: Lower chest: No acute abnormality. Hepatobiliary: There is no focal suspicious liver lesion. Diffuse periportal edema is identified. Gallbladder appears  normal without signs of gallstones, wall thickening or inflammation. The common bile duct measures up to 1 cm, image 36/5. No calcified gallstones identified within the CBD. Pancreas: Unremarkable. No pancreatic ductal dilatation or surrounding inflammatory changes. Spleen: Normal in size without focal abnormality. Adrenals/Urinary Tract: Normal adrenal glands. Mild multifocal areas of cortical scarring noted involving the right kidney. No obstructive uropathy identified bilaterally. No suspicious kidney mass. Mild circumferential wall thickening of the bladder. No focal abnormality. Stomach/Bowel: Stomach is normal. Enhancement and mild wall thickening of the terminal ileum is noted. There is diffuse colonic bowel wall edema with surrounding hazy inflammatory change compatible with pancolitis. Mucosal enhancement is identified throughout the colon. No signs of pneumatosis or bowel perforation. Vascular/Lymphatic:  Infrarenal abdominal aorta measures up to 3.8 cm. Extensive aortic atherosclerotic calcifications identified. No signs of abdominopelvic adenopathy. Reproductive: Uterus and adnexal structures are unremarkable. Other: Small volume of free fluid noted within the pelvis. No discrete fluid collections to suggest abscess. No free air. Musculoskeletal: No acute or significant osseous findings. IMPRESSION: 1. Diffuse colonic bowel wall edema with surrounding hazy inflammatory change compatible with pancolitis. No signs of pneumatosis or bowel perforation. 2. Diffuse periportal edema. 3. Mild dilatation of the common bile duct measuring up to 1 cm. No calcified gallstones identified within the CBD. Correlate with LFTs. 4. Mild circumferential wall thickening of the bladder. Correlate with urinalysis to exclude cystitis. 5. Infrarenal abdominal aortic aneurysm measuring up to 3.8 cm. Recommend follow-up every 2 years. Reference: J Am Coll Radiol 2013;10:789-794. 6. Aortic Atherosclerosis (ICD10-I70.0).  Electronically Signed   By: Signa Kell M.D.   On: 10/07/2022 10:40    Procedures Procedures  {Document cardiac monitor, telemetry assessment procedure when appropriate:1}  CRITICAL CARE Performed by: Mallarie Voorhies Total critical care time: 35 minutes Critical care time was exclusive of separately billable procedures and treating other patients. Critical care was necessary to treat or prevent imminent or life-threatening deterioration. Critical care was time spent personally by me on the following activities: development of treatment plan with patient and/or surrogate as well as nursing, discussions with consultants, evaluation of patient's response to treatment, examination of patient, obtaining history from patient or surrogate, ordering and performing treatments and interventions, ordering and review of laboratory studies, ordering and review of radiographic studies, pulse oximetry and re-evaluation of patient's condition.  Patient had significant hypokalemia with potassium of 2.4 today.  She was given oral and 3 rounds of IV potassium here, cardiac monitoring will likely need hospital admission  Medications Ordered in ED Medications  sodium chloride 0.9 % bolus 1,000 mL (has no administration in time range)  ondansetron (ZOFRAN) injection 4 mg (has no administration in time range)    ED Course/ Medical Decision Making/ A&P   {   Click here for ABCD2, HEART and other calculatorsREFRESH Note before signing :1}                              Medical Decision Making Patient here with complaint of 1 week history of watery diarrhea with nausea and decreased appetite.  Having some generalized weakness and pain of her lower abdomen.  Describes abdominal pain is crampy and mild.  No fever or chills.  No known sick contacts.  She denies any prior history of antibiotic use.  Would include but not limited to gastro enteritis, viral process including COVID, C. difficile, acute diverticulitis.   C. difficile less likely given history of no recent antibiotic use.  Amount and/or Complexity of Data Reviewed Labs: ordered. Radiology: ordered. Discussion of management or test interpretation with external provider(s): Workup today shows pancolitis with significant hypokalemia.  I have recommended hospital admission but patient declined stating that she has a disabled family member at home that she must care for.  Discussed risk involved with leaving, she verbalized understanding but continues to want to be discharged home.  Will prescribe Cipro for her pancolitis and prescription for potassium as well.  She will follow-up with her PCP later this week to have her potassium rechecked.  She agrees to ER return if her symptoms worsen.  Risk Prescription drug management.     {Document critical care time when appropriate:1} {Document review  of labs and clinical decision tools ie heart score, Chads2Vasc2 etc:1}  {Document your independent review of radiology images, and any outside records:1} {Document your discussion with family members, caretakers, and with consultants:1} {Document social determinants of health affecting pt's care:1} {Document your decision making why or why not admission, treatments were needed:1} Final Clinical Impression(s) / ED Diagnoses Final diagnoses:  None    Rx / DC Orders ED Discharge Orders     None

## 2022-10-13 ENCOUNTER — Encounter: Payer: Self-pay | Admitting: Family Medicine

## 2022-10-13 ENCOUNTER — Ambulatory Visit: Payer: 59 | Admitting: Family Medicine

## 2022-10-13 VITALS — BP 112/75 | HR 87 | Temp 97.9°F | Ht 62.0 in | Wt 122.0 lb

## 2022-10-13 DIAGNOSIS — E876 Hypokalemia: Secondary | ICD-10-CM

## 2022-10-13 DIAGNOSIS — I7143 Infrarenal abdominal aortic aneurysm, without rupture: Secondary | ICD-10-CM | POA: Diagnosis not present

## 2022-10-13 DIAGNOSIS — N3001 Acute cystitis with hematuria: Secondary | ICD-10-CM | POA: Diagnosis not present

## 2022-10-13 DIAGNOSIS — K5100 Ulcerative (chronic) pancolitis without complications: Secondary | ICD-10-CM | POA: Diagnosis not present

## 2022-10-13 LAB — POCT URINALYSIS DIP (CLINITEK)
Bilirubin, UA: NEGATIVE
Glucose, UA: NEGATIVE mg/dL
Ketones, POC UA: NEGATIVE mg/dL
Nitrite, UA: NEGATIVE
Spec Grav, UA: >=1.03 — AB (ref 1.010–1.025)
Urobilinogen, UA: NEGATIVE E.U./dL — AB
pH, UA: 6 (ref 5.0–8.0)

## 2022-10-13 NOTE — Patient Instructions (Signed)
Finish antibiotic.  Lab today.  Follow up in 3 months.

## 2022-10-15 DIAGNOSIS — E876 Hypokalemia: Secondary | ICD-10-CM | POA: Insufficient documentation

## 2022-10-15 DIAGNOSIS — K5100 Ulcerative (chronic) pancolitis without complications: Secondary | ICD-10-CM | POA: Insufficient documentation

## 2022-10-15 DIAGNOSIS — I714 Abdominal aortic aneurysm, without rupture, unspecified: Secondary | ICD-10-CM | POA: Insufficient documentation

## 2022-10-15 DIAGNOSIS — N3001 Acute cystitis with hematuria: Secondary | ICD-10-CM | POA: Insufficient documentation

## 2022-10-15 NOTE — Assessment & Plan Note (Signed)
Sending urine culture.  Finish the course of Cipro.

## 2022-10-15 NOTE — Assessment & Plan Note (Signed)
Rechecking potassium today.

## 2022-10-15 NOTE — Progress Notes (Signed)
Subjective:  Patient ID: Karen Heath, female    DOB: [birth year]  Age: 62 y.o. MRN: [id number]  CC: ER follow-up   HPI:  62 year old female presents for follow-up.  Patient recently seen in the ER on 8/3.  Presented with diarrhea and abdominal pain.  Labs revealed hyponatremia and hypokalemia.  Urinalysis was consistent with infection.  CT scan revealed diffuse colonic bowel wall edema consistent with pancolitis.  Wall thickening of the bladder was noted as well.  Additionally, mild dilation of the common bile duct was noted.  Lastly, infrarenal AAA was noted.  Follow-up imaging was recommended in 2 years.  Patient was placed on Cipro and discharged home.  Patient has completed potassium.  She has not finished antibiotic course.  Abdominal pain and diarrhea has resolved.  She states that she is feeling fatigued.  No fever.  Will discuss CT findings with her today.  Patient Active Problem List   Diagnosis Date Noted  . Pancolitis (HCC) 10/15/2022  . Hypokalemia 10/15/2022  . AAA (abdominal aortic aneurysm) (HCC) 10/15/2022  . Acute cystitis with hematuria 10/15/2022  . Migraines 04/06/2014  . Anxiety and depression 07/08/2012  . Insomnia 07/08/2012  . Glaucoma   . GERD (gastroesophageal reflux disease)     Social Hx   Social History   Socioeconomic History  . Marital status: Married    Spouse name: Not on file  . Number of children: Not on file  . Years of education: Not on file  . Highest education level: Not on file  Occupational History  . Not on file  Tobacco Use  . Smoking status: Every Day    Current packs/day: 1.00    Average packs/day: 1 pack/day for 34.0 years (34.0 ttl pk-yrs)    Types: Cigarettes  . Smokeless tobacco: Never  Substance and Sexual Activity  . Alcohol use: No  . Drug use: No  . Sexual activity: Not Currently    Birth control/protection: Abstinence  Other Topics Concern  . Not on file  Social History Narrative  . Not on file   Social  Determinants of Health   Financial Resource Strain: Not on file  Food Insecurity: Not on file  Transportation Needs: Not on file  Physical Activity: Not on file  Stress: Not on file  Social Connections: Not on file    Review of Systems Per HPI  Objective:  BP 112/75   Pulse 87   Temp 97.9 F (36.6 C)   Ht 5\' 2"  (1.575 m)   Wt 122 lb (55.3 kg)   SpO2 99%   BMI 22.31 kg/m      10/13/2022   11:13 AM 10/07/2022   12:20 PM 10/07/2022    8:54 AM  BP/Weight  Systolic BP 112 116 110  Diastolic BP 75 71 74  Wt. (Lbs) 122    BMI 22.31 kg/m2      Physical Exam Vitals and nursing note reviewed.  Constitutional:      General: She is not in acute distress.    Appearance: Normal appearance.  HENT:     Head: Normocephalic and atraumatic.  Eyes:     General:        Right eye: No discharge.        Left eye: No discharge.     Conjunctiva/sclera: Conjunctivae normal.  Cardiovascular:     Rate and Rhythm: Normal rate and regular rhythm.  Pulmonary:     Effort: Pulmonary effort is normal.     Breath sounds:  Normal breath sounds. No wheezing, rhonchi or rales.  Neurological:     Mental Status: She is alert.  Psychiatric:        Mood and Affect: Mood normal.        Behavior: Behavior normal.   Lab Results  Component Value Date   WBC 9.6 10/07/2022   HGB 12.8 10/07/2022   HCT 36.7 10/07/2022   PLT 301 10/07/2022   GLUCOSE 85 10/13/2022   CHOL 189 12/16/2021   TRIG 77 12/16/2021   HDL 40 12/16/2021   LDLCALC 135 (H) 12/16/2021   ALT 11 10/07/2022   AST 12 (L) 10/07/2022   NA 136 10/13/2022   K 5.1 10/13/2022   CL 100 10/13/2022   CREATININE 0.79 10/13/2022   BUN 11 10/13/2022   CO2 21 10/13/2022   HGBA1C 5.3 12/16/2021     Assessment & Plan:   Problem List Items Addressed This Visit       Cardiovascular and Mediastinum   AAA (abdominal aortic aneurysm) (HCC)    Noted on CT imaging.  Needs repeat imaging via ultrasound in 2 years.  He had a lengthy discussion  today regarding smoking cessation.  She will work on this.        Digestive   Pancolitis (HCC) - Primary    Improved.  Advised to finish course of Cipro.        Genitourinary   Acute cystitis with hematuria    Sending urine culture.  Finish the course of Cipro.      Relevant Orders   POCT URINALYSIS DIP (CLINITEK) (Completed)   Urine Culture (Completed)     Other   Hypokalemia    Rechecking potassium today.      Relevant Orders   Basic Metabolic Panel (Completed)    Follow-up:  Return in about 3 months (around 01/13/2023).  Everlene Other DO [facility]

## 2022-10-15 NOTE — Assessment & Plan Note (Signed)
Noted on CT imaging.  Needs repeat imaging via ultrasound in 2 years.  He had a lengthy discussion today regarding smoking cessation.  She will work on this.

## 2022-10-15 NOTE — Assessment & Plan Note (Signed)
Improved.  Advised to finish course of Cipro.

## 2022-10-30 ENCOUNTER — Encounter: Payer: Self-pay | Admitting: Family Medicine

## 2022-11-07 ENCOUNTER — Telehealth: Payer: Self-pay

## 2022-11-07 NOTE — Telephone Encounter (Signed)
Patient has been made aware of her normal lab results.

## 2022-11-12 ENCOUNTER — Other Ambulatory Visit: Payer: Self-pay | Admitting: Family Medicine

## 2022-12-08 ENCOUNTER — Other Ambulatory Visit: Payer: Self-pay | Admitting: Family Medicine

## 2022-12-08 DIAGNOSIS — Z1211 Encounter for screening for malignant neoplasm of colon: Secondary | ICD-10-CM

## 2022-12-08 DIAGNOSIS — Z1212 Encounter for screening for malignant neoplasm of rectum: Secondary | ICD-10-CM

## 2022-12-09 ENCOUNTER — Other Ambulatory Visit: Payer: Self-pay | Admitting: Family Medicine

## 2023-02-11 ENCOUNTER — Other Ambulatory Visit: Payer: Self-pay | Admitting: Family Medicine

## 2023-03-10 ENCOUNTER — Other Ambulatory Visit: Payer: Self-pay | Admitting: Family Medicine

## 2023-06-13 ENCOUNTER — Other Ambulatory Visit: Payer: Self-pay | Admitting: Family Medicine

## 2023-09-21 ENCOUNTER — Encounter: Payer: Self-pay | Admitting: Advanced Practice Midwife
